# Patient Record
Sex: Male | Born: 1950 | Race: White | Hispanic: No | Marital: Married | Smoking: Former smoker
Health system: Southern US, Community
[De-identification: ages and names within clinical notes are randomized; demographics above are authoritative.]

## PROBLEM LIST (undated history)

## (undated) DIAGNOSIS — R7303 Prediabetes: Secondary | ICD-10-CM

## (undated) DIAGNOSIS — M1990 Unspecified osteoarthritis, unspecified site: Secondary | ICD-10-CM

## (undated) DIAGNOSIS — I2510 Atherosclerotic heart disease of native coronary artery without angina pectoris: Secondary | ICD-10-CM

## (undated) DIAGNOSIS — F419 Anxiety disorder, unspecified: Secondary | ICD-10-CM

## (undated) DIAGNOSIS — G709 Myoneural disorder, unspecified: Secondary | ICD-10-CM

## (undated) DIAGNOSIS — E119 Type 2 diabetes mellitus without complications: Secondary | ICD-10-CM

## (undated) DIAGNOSIS — I639 Cerebral infarction, unspecified: Secondary | ICD-10-CM

## (undated) DIAGNOSIS — I509 Heart failure, unspecified: Secondary | ICD-10-CM

## (undated) DIAGNOSIS — I499 Cardiac arrhythmia, unspecified: Secondary | ICD-10-CM

## (undated) DIAGNOSIS — Z87442 Personal history of urinary calculi: Secondary | ICD-10-CM

## (undated) DIAGNOSIS — E785 Hyperlipidemia, unspecified: Secondary | ICD-10-CM

## (undated) DIAGNOSIS — I10 Essential (primary) hypertension: Secondary | ICD-10-CM

## (undated) HISTORY — PX: BILATERAL CARPAL TUNNEL RELEASE: SHX6508

---

## 2003-06-14 HISTORY — PX: FRACTURE SURGERY: SHX138

## 2010-06-13 DIAGNOSIS — I499 Cardiac arrhythmia, unspecified: Secondary | ICD-10-CM

## 2010-06-13 HISTORY — DX: Cardiac arrhythmia, unspecified: I49.9

## 2020-09-17 ENCOUNTER — Other Ambulatory Visit (HOSPITAL_COMMUNITY): Payer: Self-pay

## 2020-09-24 ENCOUNTER — Encounter (HOSPITAL_COMMUNITY): Payer: Self-pay

## 2020-09-24 NOTE — Patient Instructions (Addendum)
DUE TO COVID-19 ONLY ONE VISITOR IS ALLOWED TO COME WITH YOU AND STAY IN THE WAITING ROOM ONLY DURING PRE OP AND PROCEDURE DAY OF SURGERY. THE 1 VISITOR  MAY VISIT WITH YOU AFTER SURGERY IN YOUR PRIVATE ROOM DURING VISITING HOURS ONLY!  YOU NEED TO HAVE A COVID 19 TEST ON__4/25_____ @_8 :45______, THIS TEST MUST BE DONE BEFORE SURGERY,  COVID TESTING SITE [address] [place] , IT IS ON THE RIGHT GOING OUT [address] APPROXIMATELY  2 MINUTES PAST [employer] ON THE RIGHT. ONCE YOUR COVID TEST IS COMPLETED,  PLEASE BEGIN THE QUARANTINE INSTRUCTIONS AS OUTLINED IN YOUR HANDOUT.                Mike [name]   Your procedure is scheduled on: 10/07/20   Report to [facility] Main  Entrance   Report to admitting at   7:45 AM     Call this number if you have problems the morning of surgery [phone]    . BRUSH YOUR TEETH MORNING OF SURGERY AND RINSE YOUR MOUTH OUT, NO CHEWING GUM CANDY OR MINTS.   No food after midnight.    You may have clear liquid until 7:00 AM.  Nothing by mouth after 7:00 AM.    Take these medicines the morning of surgery with A SIP OF WATER: Amiodarone, Gabapentin, Carvedilol   How to Manage Your Diabetes Before and After Surgery  Why is it important to control my blood sugar before and after surgery? . Improving blood sugar levels before and after surgery helps healing and can limit problems. . A way of improving blood sugar control is eating a healthy diet by: o  Eating less sugar and carbohydrates o  Increasing activity/exercise o  Talking with your doctor about reaching your blood sugar goals . High blood sugars (greater than 180 mg/dL) can raise your risk of infections and slow your recovery, so you will need to focus on controlling your diabetes during the weeks before surgery. . Make sure that the doctor who takes care of your diabetes knows about your planned surgery including the date and location.  How do  I manage my blood sugar before surgery? . Check your blood sugar at least 4 times a day, starting 2 days before surgery, to make sure that the level is not too high or low. o Check your blood sugar the morning of your surgery when you wake up and every 2 hours until you get to the Short Stay unit. . If your blood sugar is less than 70 mg/dL, you will need to treat for low blood sugar: o Do not take insulin. o Treat a low blood sugar (less than 70 mg/dL) with  cup of clear juice (cranberry or apple), 4 glucose tablets, OR glucose gel. o Recheck blood sugar in 15 minutes after treatment (to make sure it is greater than 70 mg/dL). If your blood sugar is not greater than 70 mg/dL on recheck, call [phone] for further instructions. . Report your blood sugar to the short stay nurse when you get to Short Stay.  . If you are admitted to the hospital after surgery: o Your blood sugar will be checked by the staff and you will probably be given insulin after surgery (instead of oral diabetes medicines) to make sure you have good blood sugar levels. o The goal for blood sugar control after surgery is 80-180 mg/dL.   WHAT DO I DO ABOUT MY DIABETES MEDICATION?  [phone]  Do not take oral diabetes medicines (pills) the morning of surgery.                                  You may not have any metal on your body including              piercings  Do not wear jewelry,  lotions, powders or deodorant                       Men may shave face and neck.   Do not bring valuables to the hospital. [facility] IS NOT             RESPONSIBLE   FOR VALUABLES.  Contacts, dentures or bridgework may not be worn into surgery.                   Please read over the following fact sheets you were given: _____________________________________________________________________             [facility] - Preparing for Surgery Before surgery, you can play an important role.  Because skin is not sterile, your skin needs to  be as free of germs as possible.  You can reduce the number of germs on your skin by washing with CHG (chlorahexidine gluconate) soap before surgery.  CHG is an antiseptic cleaner which kills germs and bonds with the skin to continue killing germs even after washing. Please DO NOT use if you have an allergy to CHG or antibacterial soaps.  If your skin becomes reddened/irritated stop using the CHG and inform your nurse when you arrive at Short Stay. .  You may shave your face/neck.  Please follow these instructions carefully:  1.  Shower with CHG Soap the night before surgery and the  morning of Surgery.  2.  If you choose to wash your hair, wash your hair first as usual with your  normal  shampoo.  3.  After you shampoo, rinse your hair and body thoroughly to remove the  shampoo.                                        4.  Use CHG as you would any other liquid soap.  You can apply chg directly  to the skin and wash                       Gently with a scrungie or clean washcloth.  5.  Apply the CHG Soap to your body ONLY FROM THE NECK DOWN.   Do not use on face/ open                           Wound or open sores. Avoid contact with eyes, ears mouth and genitals (private parts).                       Wash face,  Genitals (private parts) with your normal soap.             6.  Wash thoroughly, paying special attention to the area where your surgery  will be performed.  7.  Thoroughly rinse your body with warm water from the neck down.  8.  DO NOT shower/wash with  your normal soap after using and rinsing off  the CHG Soap.             9.  Pat yourself dry with a clean towel.            10.  Wear clean pajamas.            11.  Place clean sheets on your bed the night of your first shower and do not  sleep with pets. Day of Surgery : Do not apply any lotions/deodorants the morning of surgery.  Please wear clean clothes to the hospital/surgery center.  FAILURE TO FOLLOW THESE INSTRUCTIONS MAY RESULT IN  THE CANCELLATION OF YOUR SURGERY PATIENT SIGNATURE_________________________________  NURSE SIGNATURE__________________________________  ________________________________________________________________________   Mike [name]  An incentive spirometer is a tool that can help keep your lungs clear and active. This tool measures how well you are filling your lungs with each breath. Taking long deep breaths may help reverse or decrease the chance of developing breathing (pulmonary) problems (especially infection) following:  A long period of time when you are unable to move or be active. BEFORE THE PROCEDURE   If the spirometer includes an indicator to show your best effort, your nurse or respiratory therapist will set it to a desired goal.  If possible, sit up straight or lean slightly forward. Try not to slouch.  Hold the incentive spirometer in an upright position. INSTRUCTIONS FOR USE  1. Sit on the edge of your bed if possible, or sit up as far as you can in bed or on a chair. 2. Hold the incentive spirometer in an upright position. 3. Breathe out normally. 4. Place the mouthpiece in your mouth and seal your lips tightly around it. 5. Breathe in slowly and as deeply as possible, raising the piston or the ball toward the top of the column. 6. Hold your breath for 3-5 seconds or for as long as possible. Allow the piston or ball to fall to the bottom of the column. 7. Remove the mouthpiece from your mouth and breathe out normally. 8. Rest for a few seconds and repeat Steps 1 through 7 at least 10 times every 1-2 hours when you are awake. Take your time and take a few normal breaths between deep breaths. 9. The spirometer may include an indicator to show your best effort. Use the indicator as a goal to work toward during each repetition. 10. After each set of 10 deep breaths, practice coughing to be sure your lungs are clear. If you have an incision (the cut made at the time of  surgery), support your incision when coughing by placing a pillow or rolled up towels firmly against it. Once you are able to get out of bed, walk around indoors and cough well. You may stop using the incentive spirometer when instructed by your caregiver.  RISKS AND COMPLICATIONS  Take your time so you do not get dizzy or light-headed.  If you are in pain, you may need to take or ask for pain medication before doing incentive spirometry. It is harder to take a deep breath if you are having pain. AFTER USE  Rest and breathe slowly and easily.  It can be helpful to keep track of a log of your progress. Your caregiver can provide you with a simple table to help with this. If you are using the spirometer at home, follow these instructions: SEEK MEDICAL CARE IF:   You are having difficultly using the spirometer.  You  have trouble using the spirometer as often as instructed.  Your pain medication is not giving enough relief while using the spirometer.  You develop fever of 100.5 F (38.1 C) or higher. SEEK IMMEDIATE MEDICAL CARE IF:   You cough up bloody sputum that had not been present before.  You develop fever of 102 F (38.9 C) or greater.  You develop worsening pain at or near the incision site. MAKE SURE YOU:   Understand these instructions.  Will watch your condition.  Will get help right away if you are not doing well or get worse. Document Released: 10/10/2006 Document Revised: 08/22/2011 Document Reviewed: 12/11/2006 [facility] Patient Information 2014 [place], [place].   ________________________________________________________________________

## 2020-09-28 ENCOUNTER — Other Ambulatory Visit: Payer: Self-pay

## 2020-09-28 ENCOUNTER — Encounter (HOSPITAL_COMMUNITY): Payer: Self-pay

## 2020-09-28 ENCOUNTER — Encounter (HOSPITAL_COMMUNITY)
Admission: RE | Admit: 2020-09-28 | Discharge: 2020-09-28 | Disposition: A | Discharge status: Home / Self Care | Payer: Medicare Other | Source: Ambulatory Visit | Attending: Orthopedic Surgery | Admitting: Orthopedic Surgery

## 2020-09-28 DIAGNOSIS — Z01812 Encounter for preprocedural laboratory examination: Secondary | ICD-10-CM | POA: Insufficient documentation

## 2020-09-28 HISTORY — DX: Unspecified osteoarthritis, unspecified site: M19.90

## 2020-09-28 HISTORY — DX: Type 2 diabetes mellitus without complications: E11.9

## 2020-09-28 HISTORY — DX: Personal history of urinary calculi: Z87.442

## 2020-09-28 HISTORY — DX: Essential (primary) hypertension: I10

## 2020-09-28 HISTORY — DX: Cardiac arrhythmia, unspecified: I49.9

## 2020-09-28 HISTORY — DX: Atherosclerotic heart disease of native coronary artery without angina pectoris: I25.10

## 2020-09-28 HISTORY — DX: Hyperlipidemia, unspecified: E78.5

## 2020-09-28 LAB — COMPREHENSIVE METABOLIC PANEL
ALT: 22 U/L (ref 0–44)
AST: 25 U/L (ref 15–41)
Albumin: 3.6 g/dL (ref 3.5–5.0)
Alkaline Phosphatase: 69 U/L (ref 38–126)
Anion gap: 9 (ref 5–15)
BUN: 28 mg/dL — ABNORMAL HIGH (ref 8–23)
CO2: 25 mmol/L (ref 22–32)
Calcium: 8.9 mg/dL (ref 8.9–10.3)
Chloride: 107 mmol/L (ref 98–111)
Creatinine, Ser: 1.32 mg/dL — ABNORMAL HIGH (ref 0.61–1.24)
GFR, Estimated: 59 mL/min — ABNORMAL LOW (ref >60)
Glucose, Bld: 124 mg/dL — ABNORMAL HIGH (ref 70–99)
Potassium: 4.3 mmol/L (ref 3.5–5.1)
Sodium: 141 mmol/L (ref 135–145)
Total Bilirubin: 0.5 mg/dL (ref 0.3–1.2)
Total Protein: 6.9 g/dL (ref 6.5–8.1)

## 2020-09-28 LAB — CBC
HCT: 35.2 % — ABNORMAL LOW (ref 39.0–52.0)
Hemoglobin: 11.5 g/dL — ABNORMAL LOW (ref 13.0–17.0)
MCH: 33 pg (ref 26.0–34.0)
MCHC: 32.7 g/dL (ref 30.0–36.0)
MCV: 100.9 fL — ABNORMAL HIGH (ref 80.0–100.0)
Platelets: 267 10*3/uL (ref 150–400)
RBC: 3.49 MIL/uL — ABNORMAL LOW (ref 4.22–5.81)
RDW: 14.6 % (ref 11.5–15.5)
WBC: 12.4 10*3/uL — ABNORMAL HIGH (ref 4.0–10.5)
nRBC: 0 % (ref 0.0–0.2)

## 2020-09-28 LAB — GLUCOSE, CAPILLARY: Glucose-Capillary: 124 mg/dL — ABNORMAL HIGH (ref 70–99)

## 2020-09-28 LAB — APTT: aPTT: 63 seconds — ABNORMAL HIGH (ref 24–36)

## 2020-09-28 LAB — PROTIME-INR
INR: 3.2 — ABNORMAL HIGH (ref 0.8–1.2)
Prothrombin Time: 33.1 seconds — ABNORMAL HIGH (ref 11.4–15.2)

## 2020-09-28 LAB — SURGICAL PCR SCREEN
MRSA, PCR: NEGATIVE
Staphylococcus aureus: NEGATIVE

## 2020-09-28 NOTE — Progress Notes (Signed)
COVID Vaccine Completed:no Date COVID Vaccine completed: COVID vaccine manufacturer: [employer]   PCP - Dr. Rudolpho Sevin Cardiologist - Dr. Rudolpho Sevin  Chest x-ray - no EKG - 07/17/20- Care everywhere Stress Test - 08/04/20-care everywhere ECHO - 07/22/20-care everywhere Cardiac Cath - no Pacemaker/ICD device last checked:NA  Sleep Study - no CPAP -   Fasting Blood Sugar - Pt doesn't test. He denies having diabetes but takes Metformin for blood sugar Checks Blood Sugar _____ times a day  Blood Thinner Instructions:Xarelto and ASA/ Dr. Rudolpho Sevin Aspirin Instructions:Stop 3 days prior to DOS with both/ Aluisio Last Dose:10/02/20  Anesthesia review:   Patient denies shortness of breath, fever, cough and chest pain at PAT appointment yes Patient verbalized understanding of instructions that were given to them at the PAT appointment. Patient was also instructed that they will need to review over the PAT instructions again at home before surgery.yes Pt reports no SOB with any activities

## 2020-09-29 LAB — HEMOGLOBIN A1C
Hgb A1c MFr Bld: 5.5 % (ref 4.8–5.6)
Mean Plasma Glucose: 111 mg/dL

## 2020-10-01 NOTE — Progress Notes (Signed)
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 Last office visit 07/17/20 (notes in care everywhere)   LABS:  - PT 33.1. Pt taking xarelto. Will recheck DOS.    (all labs ordered are listed, but only abnormal results are displayed)  Labs Reviewed  CBC - Abnormal; Notable for the following components:      Result Value   WBC 12.4 (*)    RBC 3.49 (*)    Hemoglobin 11.5 (*)    HCT 35.2 (*)    MCV 100.9 (*)    All other components within normal limits  COMPREHENSIVE METABOLIC PANEL - Abnormal; Notable for the following components:   Glucose, Bld 124 (*)    BUN 28 (*)    Creatinine, Ser 1.32 (*)    GFR, Estimated 59 (*)    All other components within normal limits  PROTIME-INR - Abnormal; Notable for the following components:   Prothrombin Time 33.1 (*)    INR 3.2 (*)    All other components within normal limits  APTT - Abnormal; Notable for the following components:   aPTT 63 (*)    All other components within normal limits  GLUCOSE, CAPILLARY - Abnormal; Notable for the following components:   Glucose-Capillary 124 (*)    All other components within normal limits  SURGICAL PCR SCREEN  HEMOGLOBIN A1C  TYPE AND SCREEN     EKG 07/17/18 (Dr. 09/15/18 office): Sinus bradycardia. Nonspecific T wave changes  - tracing requested from Dr. Jerrell Mylar office.    CV: Nuclear stress test 08/04/20 (care everywhere): - Fixed Apical & inferior wall defect without associated wall motion  abnormality compatible with tissue attenuation.   - No significant ischemia is suggested  - Normal left ventricular function with ejection fraction calculated at 64 %   Echo 07/22/20 (care everywhere):  - The left ventricular size is normal.  - There is normal left ventricular wall thickness.  - LV ejection fraction = 55-60%.  - Left ventricular systolic function is normal.  - Left ventricular filling pattern is normal.  - The left ventricular wall motion is normal.  - The left atrium is borderline dilated.  - There is mild aortic regurgitation.     Past Medical History:  Diagnosis Date  . Arthritis    hip, hands thumbs  . Coronary artery disease   . Diabetes mellitus without complication (HCC)   . Dysrhythmia 2012   A-Fib  . History of kidney stones   . Hyperlipidemia   . Hypertension     MEDICATIONS: . amiodarone (PACERONE) 200 MG tablet  . Ascorbic Acid (  VITAMIN C) 1000 MG tablet  . aspirin EC 81 MG tablet  . B Complex-C (B-COMPLEX WITH VITAMIN C) tablet  . carvedilol (COREG) 12.5 MG tablet  . Cholecalciferol 25 MCG (1000 UT) tablet  . gabapentin (NEURONTIN) 300 MG capsule  . ibuprofen (ADVIL) 800 MG tablet  . losartan (COZAAR) 100 MG tablet  . Menthol-Methyl Salicylate (MUSCLE RUB) 10-15 % CREA  . metFORMIN (GLUCOPHAGE) 500 MG tablet  . Multiple Vitamin (MULTIVITAMIN WITH MINERALS) TABS tablet  . pravastatin (PRAVACHOL) 20 MG tablet  . rivaroxaban (XARELTO) 20 MG TABS tablet  . Selenium (SELENIMIN-200 PO)  . Zinc 30 MG TABS   No current facility-administered medications for this encounter.    If no changes, I anticipate pt can proceed with surgery as scheduled.   Rica Mast, PhD, FNP-BC [facility]  Stay [facility]/Anesthesiology Phone: [phone] 10/01/2020 2:29 PM

## 2020-10-01 NOTE — Anesthesia Preprocedure Evaluation (Addendum)
Anesthesia Evaluation  Patient identified by MRN, date of birth, ID band Patient awake    Reviewed: Allergy & Precautions, NPO status , Patient's Chart, lab work & pertinent test results  Airway Mallampati: II  TM Distance: >3 FB Neck ROM: Full    Dental  (+) Dental Advisory Given, Chipped, Teeth Intact,    Pulmonary neg pulmonary ROS, former smoker,    Pulmonary exam normal breath sounds clear to auscultation       Cardiovascular hypertension, Pt. on medications and Pt. on home beta blockers + CAD  Normal cardiovascular exam+ dysrhythmias Atrial Fibrillation  Rhythm:Regular Rate:Normal     Neuro/Psych negative neurological ROS     GI/Hepatic negative GI ROS, Neg liver ROS,   Endo/Other  diabetes  Renal/GU negative Renal ROS     Musculoskeletal  (+) Arthritis ,   Abdominal   Peds  Hematology negative hematology ROS (+)   Anesthesia Other Findings   Reproductive/Obstetrics                           Anesthesia Physical Anesthesia Plan  ASA: III  Anesthesia Plan: General   Post-op Pain Management:    Induction: Intravenous  PONV Risk Score and Plan: 3 and Ondansetron, Dexamethasone, Midazolam, Treatment may vary due to age or medical condition and Diphenhydramine  Airway Management Planned: Oral ETT  Additional Equipment: None  Intra-op Plan:   Post-operative Plan: Extubation in OR  Informed Consent: I have reviewed the patients History and Physical, chart, labs and discussed the procedure including the risks, benefits and alternatives for the proposed anesthesia with the patient or authorized representative who has indicated his/her understanding and acceptance.     Dental advisory given  Plan Discussed with: CRNA  Anesthesia Plan Comments: (See APP note by Joslyn Hy, FNP )      Anesthesia Quick Evaluation

## 2020-10-05 ENCOUNTER — Other Ambulatory Visit (HOSPITAL_COMMUNITY)
Admission: RE | Admit: 2020-10-05 | Discharge: 2020-10-05 | Disposition: A | Discharge status: Home / Self Care | Payer: Medicare Other | Source: Ambulatory Visit | Attending: Orthopedic Surgery | Admitting: Orthopedic Surgery

## 2020-10-05 ENCOUNTER — Other Ambulatory Visit (HOSPITAL_COMMUNITY): Payer: Medicare Other

## 2020-10-05 DIAGNOSIS — Z01812 Encounter for preprocedural laboratory examination: Secondary | ICD-10-CM | POA: Insufficient documentation

## 2020-10-05 DIAGNOSIS — Z20822 Contact with and (suspected) exposure to covid-19: Secondary | ICD-10-CM | POA: Insufficient documentation

## 2020-10-05 NOTE — Progress Notes (Signed)
EKG tracing was requested from [facility] twice since 09/28/20. No response. EKG will be done DOS.

## 2020-10-06 ENCOUNTER — Encounter (HOSPITAL_COMMUNITY): Payer: Self-pay | Admitting: Orthopedic Surgery

## 2020-10-06 LAB — SARS CORONAVIRUS 2 (TAT 6-24 HRS): SARS Coronavirus 2: NEGATIVE

## 2020-10-06 NOTE — H&P (Signed)
TOTAL HIP REVISION ADMISSION H&P  Patient is admitted for conversion of previous hip surgery to left total hip arthroplasty.   Subjective:  Chief Complaint: Left hip pain  HPI: Mike [name] presented to the clinic to discuss a left total hip arthroplasty. He previously had an IA injection 03/25/2020. He did not get any relief from the injection and his pain is severe. He states the pain is so severe in the evening he can barely move. He is using a cane to walk and takes oxycodone 10 mg 2-3 x day. Mike [name] is a pleasant 70 year old male who comes to the clinic for follow-up of bilateral hip osteoarthritis. The patient indicates he is experiencing significant pain in his bilateral hips. He notes his left hip is more bothersome than his right hip. He states his pain is focal to his entire hip. The patient affirms pain in his groin area which radiates down his leg. He notes he is losing more mobility. He states the injection he received into his right hip did not provide any relief. The patient indicates his pain began to worsen approximately 1 month ago. He notes his pain is unbearable at times. He states he is having difficulty doing things in back yard and garden. He states he has to do a little activity and take a break.   We discussed the patient's presenting complaints, history, and treatment options. He has advanced end-stage arthritis of both hips, but the left is way more symptomatic than the right. He had an injection on the right, which helped, but the left side did not help. The patient has significant pain and dysfunction in their left hip. The patient has significant functional limitations and has failed non-operative management. At this [name], the most predictable means of improving his pain and function would be conversion of the previous hip surgery to a total hip arthroplasty. This will require removal of that plate. I told him it is going to be complex and this needs to be done by a  posterior approach. If we do a posterior approach, we can do this all in 1 setting. The other option is to do it in 2 stages, removing the plate first, and then coming back and doing the hip anteriorly. He is hurting so badly that he cannot wait that long to do a 2-stage procedure. It needs to be done in 1 stage. I told him it will be difficult to remove his broken screws, but we will be able to do this and we will be able to get him a good, functional hip.    We discussed the procedure risks, potential complications, and the rehabilitation course in detail. At this time, the patient would like to go ahead and proceed with conversion of previous hip surgery to a left total hip arthroplasty by a posterior approach. We will schedule this at the patient's earliest convenience. All the information has been given to Mike [name] for scheduling. He will require cardiology clearance and is in the process of getting that done.    There are no problems to display for this patient.   Past Medical History:  Diagnosis Date  . Arthritis    hip, hands thumbs  . Coronary artery disease   . Diabetes mellitus without complication (HCC)   . Dysrhythmia 2012   A-Fib  . History of kidney stones   . Hyperlipidemia   . Hypertension     History reviewed. No pertinent surgical history.  Prior to Admission medications   Medication  Sig Start Date End Date Taking? Authorizing Provider  amiodarone (PACERONE) 200 MG tablet Take 200 mg by mouth in the morning.   Yes [provider]  Ascorbic Acid (VITAMIN C) 1000 MG tablet Take 1,000 mg by mouth in the morning.   Yes [provider]  aspirin EC 81 MG tablet Take 81 mg by mouth in the morning and at bedtime. Swallow whole.   Yes [provider]  B Complex-C (B-COMPLEX WITH VITAMIN C) tablet Take 1 tablet by mouth in the morning.   Yes [provider]  carvedilol (COREG) 12.5 MG tablet Take 12.5 mg by mouth in the morning and at bedtime.    Yes [provider]  Cholecalciferol 25 MCG (1000 UT) tablet Take 1,000 Units by mouth in the morning.   Yes [provider]  gabapentin (NEURONTIN) 300 MG capsule Take 600 mg by mouth 2 (two) times daily.   Yes [provider]  ibuprofen (ADVIL) 800 MG tablet Take 800 mg by mouth every 8 (eight) hours as needed (leg pain).   Yes [provider]  losartan (COZAAR) 100 MG tablet Take 100 mg by mouth in the morning.   Yes [provider]  Menthol-Methyl Salicylate (MUSCLE RUB) 10-15 % CREA Apply 1 application topically 4 (four) times daily as needed for muscle pain.   Yes [provider]  metFORMIN (GLUCOPHAGE) 500 MG tablet Take 500 mg by mouth in the morning and at bedtime.   Yes [provider]  Multiple Vitamin (MULTIVITAMIN WITH MINERALS) TABS tablet Take 1 tablet by mouth in the morning.   Yes [provider]  pravastatin (PRAVACHOL) 20 MG tablet Take 20 mg by mouth at bedtime.   Yes [provider]  rivaroxaban (XARELTO) 20 MG TABS tablet Take 20 mg by mouth in the morning.   Yes [provider]  Selenium (SELENIMIN-200 PO) Take 200 mcg by mouth in the morning.   Yes [provider]  Zinc 30 MG TABS Take 30 mg by mouth in the morning.   Yes [provider]    No Known Allergies  Social History   Socioeconomic History  . Marital status: Married    Spouse name: Not on file  . Number of children: Not on file  . Years of education: Not on file  . Highest education level: Not on file  Occupational History  . Not on file  Tobacco Use  . Smoking status: Former Smoker    Packs/day: 0.25    Years: 5.00    Pack years: 1.25    Types: Cigarettes    Quit date: 1970    Years since quitting: 52.3  . Smokeless tobacco: Never Used  Vaping Use  . Vaping Use: Never used  Substance and Sexual Activity  . Alcohol use: Never  . Drug use: Never  . Sexual activity: Not on file  Other  Topics Concern  . Not on file  Social History Narrative  . Not on file   Social Determinants of Health   Financial Resource Strain: Not on file  Food Insecurity: Not on file  Transportation Needs: Not on file  Physical Activity: Not on file  Stress: Not on file  Social Connections: Not on file  Intimate Partner Violence: Not on file    Tobacco Use: Medium Risk  . Smoking Tobacco Use: Former Smoker  . Smokeless Tobacco Use: Never Used   Social History   Substance and Sexual Activity  Alcohol Use Never  History reviewed. No pertinent family history.  ROS: Constitutional: no fever, no chills, no night sweats, no significant weight loss Cardiovascular: no chest pain, no palpitations Respiratory: no cough, no shortness of breath, No COPD Gastrointestinal: no vomiting, no nausea Musculoskeletal: no swelling in Joints, Joint Pain Neurologic: no numbness, no tingling, no difficulty with balance    Objective:  Physical Exam: Well nourished and well developed.  General: Alert and oriented x3, cooperative and pleasant, no acute distress.  Head: normocephalic, atraumatic, neck supple.  Eyes: EOMI.  Respiratory: breath sounds clear in all fields, no wheezing, rales, or rhonchi. Cardiovascular: Regular rate and rhythm, no murmurs, gallops or rubs.  Abdomen: non-tender to palpation and soft, normoactive bowel sounds. Musculoskeletal:  The patient is having a very difficult time ambulating today. He is utilizing a cane and has a significantly antalgic gait pattern.    Right Hip Exam:  Range of motion: Flexion to 100 degrees, internal rotation to 10 degrees, external rotation to 20 degrees, and abduction to 20 degrees with pain on range of motion.    Left Hip Exam:  Range of motion: Flexion to 100 degrees with pain, internal rotation is minimal, external rotation to 10 to 20 degrees, and abduction to 20 degrees with pain.  There is positive tenderness over the greater  trochanteric bursa.    The patient's sensation and motor function are intact in their lower extremities. Their distal pulses are 2+. The bilateral calves are soft and non-tender.   Vital signs in last 24 hours:    Imaging Review AP pelvis, AP and lateral of the left hip dated 07/24/2020 demonstrate a blade plate on that side with approximately 6 screws. The bottom 2 screws are broken. He has severe end-stage arthritis, bone on bone, with large osteophyte formation and some erosion of the femoral head. He also has advanced arthritic change of the right hip.  Assessment/Plan:  Failure of previous hip surgery, left hip  The patient history, physical examination, clinical judgement of the provider and imaging studies are consistent with end stage degenerative joint disease of the left hip and total hip arthroplasty is deemed medically necessary. The treatment options including medical management, injection therapy, arthroscopy and arthroplasty were discussed at length. The risks and benefits of total hip arthroplasty were presented and reviewed. The risks due to aseptic loosening, infection, stiffness, dislocation/subluxation, thromboembolic complications and other imponderables were discussed. The patient acknowledged the explanation, agreed to proceed with the plan and consent was signed. Patient is being admitted for inpatient treatment for surgery, pain control, PT, OT, prophylactic antibiotics, VTE prophylaxis, progressive ambulation and ADLs and discharge planning.The patient is planning to be discharged home.   Patient's anticipated LOS is less than 2 midnights, meeting these requirements: - Lives within 1 hour of care - Has a competent adult at home to recover with post-op recover - NO history of  - Chronic pain requiring opioids  - Diabetes  - Coronary Artery Disease  - Heart failure  - Heart attack  - Stroke  - DVT/VTE  - Cardiac arrhythmia  - Respiratory Failure/COPD  - Renal  failure  - Anemia  - Advanced Liver disease   [name] [name] Disposition: Home with wife Planned DVT Prophylaxis: Xarelto DME Needed: None PCP: Mike [name] (clearance not received) Cardiology: Mike [name] (clearance received) TXA: IV Allergies: NKDA Anesthesia Concerns: None BMI: 26.6 Last HgbA1c: N/A  Pharm[place] [facility] [address] [name]     - Patient was instructed on what medications to stop prior to surgery. - Follow-up visit in 2 weeks with Dr. Lequita Halt - Begin physical therapy following surgery - Pre-operative lab work as pre-surgical testing - Prescriptions will be provided in hospital at time of discharge  Mike [name], [facility], PA-C Orthopedic Surgery [facility] [place]

## 2020-10-07 ENCOUNTER — Inpatient Hospital Stay (HOSPITAL_COMMUNITY): Payer: Medicare Other | Admitting: Emergency Medicine

## 2020-10-07 ENCOUNTER — Observation Stay (HOSPITAL_COMMUNITY): Payer: Medicare Other

## 2020-10-07 ENCOUNTER — Encounter (HOSPITAL_COMMUNITY): Payer: Self-pay | Admitting: Orthopedic Surgery

## 2020-10-07 ENCOUNTER — Inpatient Hospital Stay (HOSPITAL_COMMUNITY)
Admission: RE | Admit: 2020-10-07 | Discharge: 2020-10-08 | DRG: 470 | Disposition: A | Discharge status: Home Health | Payer: Medicare Other | Source: Ambulatory Visit | Attending: Orthopedic Surgery | Admitting: Orthopedic Surgery

## 2020-10-07 ENCOUNTER — Encounter (HOSPITAL_COMMUNITY)
Admission: RE | Disposition: A | Discharge status: Home / Self Care | Payer: Self-pay | Source: Ambulatory Visit | Attending: Orthopedic Surgery

## 2020-10-07 ENCOUNTER — Other Ambulatory Visit: Payer: Self-pay

## 2020-10-07 DIAGNOSIS — E785 Hyperlipidemia, unspecified: Secondary | ICD-10-CM | POA: Diagnosis present

## 2020-10-07 DIAGNOSIS — Z87891 Personal history of nicotine dependence: Secondary | ICD-10-CM | POA: Diagnosis not present

## 2020-10-07 DIAGNOSIS — Z7901 Long term (current) use of anticoagulants: Secondary | ICD-10-CM | POA: Diagnosis not present

## 2020-10-07 DIAGNOSIS — I10 Essential (primary) hypertension: Secondary | ICD-10-CM | POA: Diagnosis present

## 2020-10-07 DIAGNOSIS — T8489XA Other specified complication of internal orthopedic prosthetic devices, implants and grafts, initial encounter: Secondary | ICD-10-CM | POA: Diagnosis present

## 2020-10-07 DIAGNOSIS — Z7984 Long term (current) use of oral hypoglycemic drugs: Secondary | ICD-10-CM

## 2020-10-07 DIAGNOSIS — I2510 Atherosclerotic heart disease of native coronary artery without angina pectoris: Secondary | ICD-10-CM | POA: Diagnosis present

## 2020-10-07 DIAGNOSIS — E119 Type 2 diabetes mellitus without complications: Secondary | ICD-10-CM | POA: Diagnosis present

## 2020-10-07 DIAGNOSIS — Z79899 Other long term (current) drug therapy: Secondary | ICD-10-CM | POA: Diagnosis not present

## 2020-10-07 DIAGNOSIS — Z28310 Unvaccinated for covid-19: Secondary | ICD-10-CM | POA: Diagnosis not present

## 2020-10-07 DIAGNOSIS — M169 Osteoarthritis of hip, unspecified: Secondary | ICD-10-CM | POA: Diagnosis present

## 2020-10-07 DIAGNOSIS — Z20822 Contact with and (suspected) exposure to covid-19: Secondary | ICD-10-CM | POA: Diagnosis present

## 2020-10-07 DIAGNOSIS — Z96642 Presence of left artificial hip joint: Secondary | ICD-10-CM | POA: Diagnosis present

## 2020-10-07 DIAGNOSIS — Y848 Other medical procedures as the cause of abnormal reaction of the patient, or of later complication, without mention of misadventure at the time of the procedure: Secondary | ICD-10-CM | POA: Diagnosis present

## 2020-10-07 DIAGNOSIS — Z7982 Long term (current) use of aspirin: Secondary | ICD-10-CM | POA: Diagnosis not present

## 2020-10-07 DIAGNOSIS — M1612 Unilateral primary osteoarthritis, left hip: Secondary | ICD-10-CM | POA: Diagnosis present

## 2020-10-07 DIAGNOSIS — Z96649 Presence of unspecified artificial hip joint: Secondary | ICD-10-CM

## 2020-10-07 HISTORY — PX: TOTAL HIP ARTHROPLASTY: SHX124

## 2020-10-07 LAB — APTT: aPTT: 36 seconds (ref 24–36)

## 2020-10-07 LAB — GLUCOSE, CAPILLARY
Glucose-Capillary: 106 mg/dL — ABNORMAL HIGH (ref 70–99)
Glucose-Capillary: 106 mg/dL — ABNORMAL HIGH (ref 70–99)
Glucose-Capillary: 120 mg/dL — ABNORMAL HIGH (ref 70–99)
Glucose-Capillary: 218 mg/dL — ABNORMAL HIGH (ref 70–99)

## 2020-10-07 LAB — ABO/RH: ABO/RH(D): A POS

## 2020-10-07 LAB — PROTIME-INR
INR: 1 (ref 0.8–1.2)
Prothrombin Time: 13.3 seconds (ref 11.4–15.2)

## 2020-10-07 SURGERY — ARTHROPLASTY, HIP, TOTAL,POSTERIOR APPROACH
Anesthesia: General | Site: Hip | Laterality: Left

## 2020-10-07 MED ORDER — KETAMINE HCL 10 MG/ML IJ SOLN
INTRAMUSCULAR | Status: AC
  Filled 2020-10-07: qty 1

## 2020-10-07 MED ORDER — ONDANSETRON HCL 4 MG/2ML IJ SOLN
INTRAMUSCULAR | Status: DC | PRN
  Administered 2020-10-07: 4 mg via INTRAVENOUS

## 2020-10-07 MED ORDER — DEXAMETHASONE SODIUM PHOSPHATE 10 MG/ML IJ SOLN
INTRAMUSCULAR | Status: AC
  Filled 2020-10-07: qty 1

## 2020-10-07 MED ORDER — ROCURONIUM BROMIDE 10 MG/ML (PF) SYRINGE
PREFILLED_SYRINGE | INTRAVENOUS | Status: DC | PRN
  Administered 2020-10-07: 60 mg via INTRAVENOUS
  Administered 2020-10-07: 10 mg via INTRAVENOUS

## 2020-10-07 MED ORDER — PRAVASTATIN SODIUM 20 MG PO TABS
20.0000 mg | ORAL_TABLET | Freq: Every day | ORAL | Status: DC
  Filled 2020-10-07: qty 1

## 2020-10-07 MED ORDER — PROPOFOL 10 MG/ML IV BOLUS
INTRAVENOUS | Status: AC
  Filled 2020-10-07: qty 20

## 2020-10-07 MED ORDER — METOCLOPRAMIDE HCL 5 MG PO TABS: 5.0000 mg | ORAL_TABLET | Freq: Three times a day (TID) | ORAL | Status: DC | PRN

## 2020-10-07 MED ORDER — TRAMADOL HCL 50 MG PO TABS
50.0000 mg | ORAL_TABLET | Freq: Four times a day (QID) | ORAL | Status: DC | PRN
  Administered 2020-10-07 – 2020-10-08 (×2): 100 mg via ORAL
  Filled 2020-10-07 (×2): qty 2

## 2020-10-07 MED ORDER — PROPOFOL 1000 MG/100ML IV EMUL
INTRAVENOUS | Status: AC
  Filled 2020-10-07: qty 100

## 2020-10-07 MED ORDER — DIPHENHYDRAMINE HCL 50 MG/ML IJ SOLN
INTRAMUSCULAR | Status: AC
  Filled 2020-10-07: qty 1

## 2020-10-07 MED ORDER — FENTANYL CITRATE (PF) 100 MCG/2ML IJ SOLN
INTRAMUSCULAR | Status: AC
  Filled 2020-10-07: qty 2

## 2020-10-07 MED ORDER — CARVEDILOL 12.5 MG PO TABS
12.5000 mg | ORAL_TABLET | Freq: Two times a day (BID) | ORAL | Status: DC
  Administered 2020-10-07 – 2020-10-08 (×2): 12.5 mg via ORAL
  Filled 2020-10-07 (×2): qty 1

## 2020-10-07 MED ORDER — ALBUMIN HUMAN 5 % IV SOLN
INTRAVENOUS | Status: AC
  Filled 2020-10-07: qty 500

## 2020-10-07 MED ORDER — MIDAZOLAM HCL 5 MG/5ML IJ SOLN
INTRAMUSCULAR | Status: DC | PRN
  Administered 2020-10-07: 2 mg via INTRAVENOUS

## 2020-10-07 MED ORDER — DOCUSATE SODIUM 100 MG PO CAPS
100.0000 mg | ORAL_CAPSULE | Freq: Two times a day (BID) | ORAL | Status: DC
  Administered 2020-10-07 – 2020-10-08 (×2): 100 mg via ORAL
  Filled 2020-10-07 (×2): qty 1

## 2020-10-07 MED ORDER — HYDROMORPHONE HCL 1 MG/ML IJ SOLN
0.2500 mg | INTRAMUSCULAR | Status: DC | PRN
  Administered 2020-10-07 (×2): 0.25 mg via INTRAVENOUS

## 2020-10-07 MED ORDER — DEXAMETHASONE SODIUM PHOSPHATE 10 MG/ML IJ SOLN
8.0000 mg | Freq: Once | INTRAMUSCULAR | Status: AC
  Administered 2020-10-07: 8 mg via INTRAVENOUS

## 2020-10-07 MED ORDER — METHOCARBAMOL 500 MG IVPB - SIMPLE MED
500.0000 mg | Freq: Four times a day (QID) | INTRAVENOUS | Status: DC | PRN
  Administered 2020-10-07: 500 mg via INTRAVENOUS
  Filled 2020-10-07: qty 50

## 2020-10-07 MED ORDER — CELECOXIB 200 MG PO CAPS
200.0000 mg | ORAL_CAPSULE | Freq: Once | ORAL | Status: AC
  Administered 2020-10-07: 200 mg via ORAL

## 2020-10-07 MED ORDER — MENTHOL 3 MG MT LOZG: 1.0000 | LOZENGE | OROMUCOSAL | Status: DC | PRN

## 2020-10-07 MED ORDER — CEFAZOLIN SODIUM-DEXTROSE 2-4 GM/100ML-% IV SOLN
2.0000 g | INTRAVENOUS | Status: AC
  Administered 2020-10-07: 2 g via INTRAVENOUS
  Filled 2020-10-07: qty 100

## 2020-10-07 MED ORDER — PHENOL 1.4 % MT LIQD: 1.0000 | OROMUCOSAL | Status: DC | PRN

## 2020-10-07 MED ORDER — FENTANYL CITRATE (PF) 100 MCG/2ML IJ SOLN
INTRAMUSCULAR | Status: AC
  Administered 2020-10-07: 50 ug via INTRAVENOUS
  Filled 2020-10-07: qty 2

## 2020-10-07 MED ORDER — KETAMINE HCL 10 MG/ML IJ SOLN
INTRAMUSCULAR | Status: DC | PRN
  Administered 2020-10-07: 30 mg via INTRAVENOUS

## 2020-10-07 MED ORDER — METHOCARBAMOL 500 MG PO TABS
500.0000 mg | ORAL_TABLET | Freq: Four times a day (QID) | ORAL | Status: DC | PRN
  Administered 2020-10-07 – 2020-10-08 (×3): 500 mg via ORAL
  Filled 2020-10-07 (×3): qty 1

## 2020-10-07 MED ORDER — ACETAMINOPHEN 325 MG PO TABS: 325.0000 mg | ORAL_TABLET | Freq: Four times a day (QID) | ORAL | Status: DC | PRN

## 2020-10-07 MED ORDER — ALBUMIN HUMAN 5 % IV SOLN: INTRAVENOUS | Status: DC | PRN

## 2020-10-07 MED ORDER — SUGAMMADEX SODIUM 200 MG/2ML IV SOLN
INTRAVENOUS | Status: DC | PRN
  Administered 2020-10-07: 200 mg via INTRAVENOUS

## 2020-10-07 MED ORDER — TRANEXAMIC ACID-NACL 1000-0.7 MG/100ML-% IV SOLN
1000.0000 mg | INTRAVENOUS | Status: AC
  Administered 2020-10-07: 1000 mg via INTRAVENOUS
  Filled 2020-10-07: qty 100

## 2020-10-07 MED ORDER — ROCURONIUM BROMIDE 10 MG/ML (PF) SYRINGE
PREFILLED_SYRINGE | INTRAVENOUS | Status: AC
  Filled 2020-10-07: qty 10

## 2020-10-07 MED ORDER — GABAPENTIN 300 MG PO CAPS
600.0000 mg | ORAL_CAPSULE | Freq: Two times a day (BID) | ORAL | Status: DC
  Administered 2020-10-07 – 2020-10-08 (×2): 600 mg via ORAL
  Filled 2020-10-07 (×2): qty 2

## 2020-10-07 MED ORDER — LACTATED RINGERS IV SOLN: INTRAVENOUS | Status: DC

## 2020-10-07 MED ORDER — POVIDONE-IODINE 10 % EX SWAB
2.0000 "application " | Freq: Once | CUTANEOUS | Status: AC
  Administered 2020-10-07: 2 via TOPICAL

## 2020-10-07 MED ORDER — RIVAROXABAN 10 MG PO TABS
10.0000 mg | ORAL_TABLET | Freq: Every day | ORAL | Status: DC
  Administered 2020-10-08: 10 mg via ORAL
  Filled 2020-10-07: qty 1

## 2020-10-07 MED ORDER — CELECOXIB 200 MG PO CAPS
ORAL_CAPSULE | ORAL | Status: AC
  Filled 2020-10-07: qty 1

## 2020-10-07 MED ORDER — CEFAZOLIN SODIUM-DEXTROSE 2-4 GM/100ML-% IV SOLN
2.0000 g | Freq: Four times a day (QID) | INTRAVENOUS | Status: AC
  Administered 2020-10-07 (×2): 2 g via INTRAVENOUS
  Filled 2020-10-07 (×2): qty 100

## 2020-10-07 MED ORDER — LIDOCAINE 2% (20 MG/ML) 5 ML SYRINGE
INTRAMUSCULAR | Status: DC | PRN
  Administered 2020-10-07: 80 mg via INTRAVENOUS

## 2020-10-07 MED ORDER — LOSARTAN POTASSIUM 50 MG PO TABS
100.0000 mg | ORAL_TABLET | Freq: Every day | ORAL | Status: DC
  Administered 2020-10-08: 100 mg via ORAL
  Filled 2020-10-07 (×2): qty 2

## 2020-10-07 MED ORDER — ACETAMINOPHEN 10 MG/ML IV SOLN
1000.0000 mg | Freq: Four times a day (QID) | INTRAVENOUS | Status: DC
  Administered 2020-10-07: 1000 mg via INTRAVENOUS
  Filled 2020-10-07: qty 100

## 2020-10-07 MED ORDER — MAGNESIUM CITRATE PO SOLN: 1.0000 | Freq: Once | ORAL | Status: DC | PRN

## 2020-10-07 MED ORDER — MORPHINE SULFATE (PF) 2 MG/ML IV SOLN: 0.5000 mg | INTRAVENOUS | Status: DC | PRN

## 2020-10-07 MED ORDER — DEXMEDETOMIDINE (PRECEDEX) IN NS 20 MCG/5ML (4 MCG/ML) IV SYRINGE
PREFILLED_SYRINGE | INTRAVENOUS | Status: DC | PRN
  Administered 2020-10-07: 4 ug via INTRAVENOUS

## 2020-10-07 MED ORDER — BUPIVACAINE-EPINEPHRINE (PF) 0.25% -1:200000 IJ SOLN
INTRAMUSCULAR | Status: AC
  Filled 2020-10-07: qty 30

## 2020-10-07 MED ORDER — ONDANSETRON HCL 4 MG/2ML IJ SOLN: 4.0000 mg | Freq: Four times a day (QID) | INTRAMUSCULAR | Status: DC | PRN

## 2020-10-07 MED ORDER — FENTANYL CITRATE (PF) 100 MCG/2ML IJ SOLN
INTRAMUSCULAR | Status: DC | PRN
  Administered 2020-10-07: 25 ug via INTRAVENOUS
  Administered 2020-10-07 (×2): 50 ug via INTRAVENOUS

## 2020-10-07 MED ORDER — FENTANYL CITRATE (PF) 100 MCG/2ML IJ SOLN
50.0000 ug | INTRAMUSCULAR | Status: DC
  Administered 2020-10-07: 50 ug via INTRAVENOUS

## 2020-10-07 MED ORDER — CHLORHEXIDINE GLUCONATE 0.12 % MT SOLN
15.0000 mL | Freq: Once | OROMUCOSAL | Status: AC
  Administered 2020-10-07: 15 mL via OROMUCOSAL

## 2020-10-07 MED ORDER — MEPERIDINE HCL 50 MG/ML IJ SOLN: 6.2500 mg | INTRAMUSCULAR | Status: DC | PRN

## 2020-10-07 MED ORDER — DEXAMETHASONE SODIUM PHOSPHATE 10 MG/ML IJ SOLN
10.0000 mg | Freq: Once | INTRAMUSCULAR | Status: AC
  Administered 2020-10-08: 10 mg via INTRAVENOUS
  Filled 2020-10-07: qty 1

## 2020-10-07 MED ORDER — HYDROCODONE-ACETAMINOPHEN 5-325 MG PO TABS
1.0000 | ORAL_TABLET | ORAL | Status: DC | PRN
  Administered 2020-10-07 – 2020-10-08 (×5): 2 via ORAL
  Filled 2020-10-07 (×5): qty 2

## 2020-10-07 MED ORDER — INSULIN ASPART 100 UNIT/ML ~~LOC~~ SOLN
0.0000 [IU] | Freq: Three times a day (TID) | SUBCUTANEOUS | Status: DC
  Administered 2020-10-08: 2 [IU] via SUBCUTANEOUS

## 2020-10-07 MED ORDER — POLYETHYLENE GLYCOL 3350 17 G PO PACK: 17.0000 g | PACK | Freq: Every day | ORAL | Status: DC | PRN

## 2020-10-07 MED ORDER — MIDAZOLAM HCL 2 MG/2ML IJ SOLN
INTRAMUSCULAR | Status: AC
  Filled 2020-10-07: qty 2

## 2020-10-07 MED ORDER — DEXMEDETOMIDINE (PRECEDEX) IN NS 20 MCG/5ML (4 MCG/ML) IV SYRINGE
PREFILLED_SYRINGE | INTRAVENOUS | Status: AC
  Filled 2020-10-07: qty 5

## 2020-10-07 MED ORDER — METHOCARBAMOL 500 MG IVPB - SIMPLE MED
INTRAVENOUS | Status: AC
  Filled 2020-10-07: qty 50

## 2020-10-07 MED ORDER — BISACODYL 10 MG RE SUPP: 10.0000 mg | Freq: Every day | RECTAL | Status: DC | PRN

## 2020-10-07 MED ORDER — 0.9 % SODIUM CHLORIDE (POUR BTL) OPTIME
TOPICAL | Status: DC | PRN
  Administered 2020-10-07: 1000 mL

## 2020-10-07 MED ORDER — LIDOCAINE 2% (20 MG/ML) 5 ML SYRINGE
INTRAMUSCULAR | Status: AC
  Filled 2020-10-07: qty 5

## 2020-10-07 MED ORDER — DIPHENHYDRAMINE HCL 50 MG/ML IJ SOLN
INTRAMUSCULAR | Status: DC | PRN
  Administered 2020-10-07: 6.25 mg via INTRAVENOUS

## 2020-10-07 MED ORDER — ORAL CARE MOUTH RINSE: 15.0000 mL | Freq: Once | OROMUCOSAL | Status: AC

## 2020-10-07 MED ORDER — ONDANSETRON HCL 4 MG PO TABS: 4.0000 mg | ORAL_TABLET | Freq: Four times a day (QID) | ORAL | Status: DC | PRN

## 2020-10-07 MED ORDER — PROPOFOL 10 MG/ML IV BOLUS
INTRAVENOUS | Status: DC | PRN
  Administered 2020-10-07: 200 mg via INTRAVENOUS

## 2020-10-07 MED ORDER — EPHEDRINE 5 MG/ML INJ
INTRAVENOUS | Status: AC
  Filled 2020-10-07: qty 10

## 2020-10-07 MED ORDER — ACETAMINOPHEN 500 MG PO TABS: 1000.0000 mg | ORAL_TABLET | Freq: Once | ORAL | Status: DC

## 2020-10-07 MED ORDER — AMIODARONE HCL 200 MG PO TABS
200.0000 mg | ORAL_TABLET | Freq: Every day | ORAL | Status: DC
  Administered 2020-10-08: 200 mg via ORAL
  Filled 2020-10-07: qty 1

## 2020-10-07 MED ORDER — EPHEDRINE SULFATE-NACL 50-0.9 MG/10ML-% IV SOSY
PREFILLED_SYRINGE | INTRAVENOUS | Status: DC | PRN
  Administered 2020-10-07: 10 mg via INTRAVENOUS
  Administered 2020-10-07 (×2): 15 mg via INTRAVENOUS
  Administered 2020-10-07 (×2): 10 mg via INTRAVENOUS

## 2020-10-07 MED ORDER — DROPERIDOL 2.5 MG/ML IJ SOLN: 0.6250 mg | Freq: Once | INTRAMUSCULAR | Status: DC | PRN

## 2020-10-07 MED ORDER — ONDANSETRON HCL 4 MG/2ML IJ SOLN
INTRAMUSCULAR | Status: AC
  Filled 2020-10-07: qty 2

## 2020-10-07 MED ORDER — PHENYLEPHRINE HCL-NACL 10-0.9 MG/250ML-% IV SOLN
INTRAVENOUS | Status: DC | PRN
  Administered 2020-10-07: 30 ug/min via INTRAVENOUS

## 2020-10-07 MED ORDER — METOCLOPRAMIDE HCL 5 MG/ML IJ SOLN: 5.0000 mg | Freq: Three times a day (TID) | INTRAMUSCULAR | Status: DC | PRN

## 2020-10-07 MED ORDER — BUPIVACAINE-EPINEPHRINE (PF) 0.25% -1:200000 IJ SOLN: INTRAMUSCULAR | Status: DC | PRN

## 2020-10-07 MED ORDER — HYDROMORPHONE HCL 1 MG/ML IJ SOLN
INTRAMUSCULAR | Status: AC
  Filled 2020-10-07: qty 1

## 2020-10-07 MED ORDER — SODIUM CHLORIDE 0.9 % IV SOLN: INTRAVENOUS | Status: DC

## 2020-10-07 SURGICAL SUPPLY — 76 items
BAG DECANTER FOR FLEXI CONT (MISCELLANEOUS) ×2 IMPLANT
BAG ZIPLOCK 12X15 (MISCELLANEOUS) ×2 IMPLANT
BIT DRILL 2.8X128 (BIT) ×2 IMPLANT
BIT DRILL 200X4XGRY 5XPIN (BIT) ×1 IMPLANT
BIT DRL 200X4XGRY 5XPIN (BIT) ×1
BLADE EXTENDED COATED 6.5IN (ELECTRODE) ×2 IMPLANT
BLADE SAW SAG 73X25 THK (BLADE) ×1
BLADE SAW SGTL 73X25 THK (BLADE) ×1 IMPLANT
BLADE SURG SZ10 CARB STEEL (BLADE) ×4 IMPLANT
CATH COUDE 5CC RIBBED (CATHETERS) ×1 IMPLANT
CATH RIBBED COUDE 5CC (CATHETERS) ×1
COVER SURGICAL LIGHT HANDLE (MISCELLANEOUS) ×2 IMPLANT
COVER WAND RF STERILE (DRAPES) ×2 IMPLANT
CUP ACETBLR 54 OD PINNACLE (Hips) ×2 IMPLANT
DECANTER SPIKE VIAL GLASS SM (MISCELLANEOUS) IMPLANT
DRAPE INCISE IOBAN 66X45 STRL (DRAPES) ×2 IMPLANT
DRAPE ORTHO SPLIT 77X108 STRL (DRAPES) ×2
DRAPE POUCH INSTRU U-SHP 10X18 (DRAPES) ×2 IMPLANT
DRAPE SURG ORHT 6 SPLT 77X108 (DRAPES) ×2 IMPLANT
DRAPE U-SHAPE 47X51 STRL (DRAPES) ×2 IMPLANT
DRESSING MEPILEX FLEX 4X4 (GAUZE/BANDAGES/DRESSINGS) IMPLANT
DRILL BIT 4.0MM (BIT) ×1
DRSG AQUACEL AG ADV 3.5X10 (GAUZE/BANDAGES/DRESSINGS) ×2 IMPLANT
DRSG AQUACEL AG ADV 3.5X14 (GAUZE/BANDAGES/DRESSINGS) ×2 IMPLANT
DRSG MEPILEX FLEX 4X4 (GAUZE/BANDAGES/DRESSINGS)
DURAPREP 26ML APPLICATOR (WOUND CARE) ×2 IMPLANT
ELECT REM PT RETURN 15FT ADLT (MISCELLANEOUS) ×2 IMPLANT
EVACUATOR 1/8 PVC DRAIN (DRAIN) IMPLANT
EXTRACTOR BROKEN SCREW 4 (INSTRUMENTS) ×4 IMPLANT
EXTRACTOR BROKEN SCREW 8X05 (INSTRUMENTS) ×4 IMPLANT
FACESHIELD WRAPAROUND (MASK) ×8 IMPLANT
GAUZE SPONGE 4X4 12PLY STRL (GAUZE/BANDAGES/DRESSINGS) ×2 IMPLANT
GLOVE SRG 8 PF TXTR STRL LF DI (GLOVE) ×1 IMPLANT
GLOVE SURG ENC MOIS LTX SZ6.5 (GLOVE) ×4 IMPLANT
GLOVE SURG ENC MOIS LTX SZ8 (GLOVE) ×4 IMPLANT
GLOVE SURG UNDER POLY LF SZ7 (GLOVE) ×6 IMPLANT
GLOVE SURG UNDER POLY LF SZ8 (GLOVE) ×1
GOWN STRL REUS W/TWL LRG LVL3 (GOWN DISPOSABLE) ×4 IMPLANT
HEAD CERAMIC BIOLOX DELTA 36 6 (Hips) ×2 IMPLANT
HOLDER FOLEY CATH W/STRAP (MISCELLANEOUS) ×2 IMPLANT
IMMOBILIZER KNEE 20 (SOFTGOODS) ×2
IMMOBILIZER KNEE 20 THIGH 36 (SOFTGOODS) ×1 IMPLANT
KIT BASIN OR (CUSTOM PROCEDURE TRAY) ×2 IMPLANT
KIT TURNOVER KIT A (KITS) ×2 IMPLANT
LINER MARATHON NEUT +4X54X36 (Hips) ×2 IMPLANT
MANIFOLD NEPTUNE II (INSTRUMENTS) ×2 IMPLANT
NDL SAFETY ECLIPSE 18X1.5 (NEEDLE) ×1 IMPLANT
NEEDLE HYPO 18GX1.5 SHARP (NEEDLE) ×1
NS IRRIG 1000ML POUR BTL (IV SOLUTION) ×2 IMPLANT
PACK TOTAL JOINT (CUSTOM PROCEDURE TRAY) ×2 IMPLANT
PADDING CAST COTTON 6X4 STRL (CAST SUPPLIES) ×2 IMPLANT
PASSER SUT SWANSON 36MM LOOP (INSTRUMENTS) ×2 IMPLANT
PENCIL SMOKE EVACUATOR (MISCELLANEOUS) ×2 IMPLANT
PROTECTOR NERVE ULNAR (MISCELLANEOUS) ×2 IMPLANT
SLEEVE FEM PROX 20F LRG (Hips) ×2 IMPLANT
SLEEVE SCD COMPRESS KNEE MED (STOCKING) ×2 IMPLANT
SPONGE LAP 18X18 RF (DISPOSABLE) ×2 IMPLANT
SROM FEM STEM STD 20X15 36+12 (Hips) ×2 IMPLANT
STEM FEM SROM STD 20X15 36+12 (Hips) ×1 IMPLANT
STRIP CLOSURE SKIN 1/2X4 (GAUZE/BANDAGES/DRESSINGS) ×4 IMPLANT
SUT ETHIBOND NAB CT1 #1 30IN (SUTURE) ×4 IMPLANT
SUT MNCRL AB 4-0 PS2 18 (SUTURE) ×4 IMPLANT
SUT STRATAFIX 0 PDS 27 VIOLET (SUTURE) ×2
SUT VIC AB 1 CT1 36 (SUTURE) ×2 IMPLANT
SUT VIC AB 2-0 CT1 27 (SUTURE) ×5
SUT VIC AB 2-0 CT1 TAPERPNT 27 (SUTURE) ×5 IMPLANT
SUTURE STRATFX 0 PDS 27 VIOLET (SUTURE) ×1 IMPLANT
SYR 20ML LL LF (SYRINGE) ×2 IMPLANT
SYR 50ML LL SCALE MARK (SYRINGE) IMPLANT
TOWEL OR 17X26 10 PK STRL BLUE (TOWEL DISPOSABLE) ×4 IMPLANT
TOWEL OR NON WOVEN STRL DISP B (DISPOSABLE) ×2 IMPLANT
TRAY FOLEY MTR SLVR 14FR STAT (SET/KITS/TRAYS/PACK) ×2 IMPLANT
TRAY FOLEY MTR SLVR 16FR STAT (SET/KITS/TRAYS/PACK) ×2 IMPLANT
TUBE SUCTION HIGH CAP CLEAR NV (SUCTIONS) ×2 IMPLANT
TUBING CONNECTING 10 (TUBING) ×2 IMPLANT
WATER STERILE IRR 1000ML POUR (IV SOLUTION) ×4 IMPLANT

## 2020-10-07 NOTE — Anesthesia Postprocedure Evaluation (Signed)
Anesthesia Post Note  Patient: Mike [name]  Procedure(s) Performed: Conversion of previous hip surgery to left total hip arthroplasty-posterior approach (Left Hip)     Patient location during evaluation: PACU Anesthesia Type: General Level of consciousness: sedated and patient cooperative Pain management: pain level controlled Vital Signs Assessment: post-procedure vital signs reviewed and stable Respiratory status: spontaneous breathing Cardiovascular status: stable Anesthetic complications: no   No complications documented.  Last Vitals:  Vitals:   10/07/20 1415 10/07/20 1425  BP: 129/83 124/82  Pulse: (!) 58 61  Resp: 15 16  Temp: (!) 36.4 C 36.6 C  SpO2: 99% 97%    Last Pain:  Vitals:   10/07/20 1556  TempSrc:   PainSc: Asleep                 Lewie Loron

## 2020-10-07 NOTE — Anesthesia Procedure Notes (Signed)
Procedure Name: Intubation Date/Time: 10/07/2020 9:39 AM Performed by: West Pugh, CRNA Pre-anesthesia Checklist: Patient identified, Emergency Drugs available, Suction available, Patient being monitored and Timeout performed Patient Re-evaluated:Patient Re-evaluated prior to induction Oxygen Delivery Method: Circle system utilized Preoxygenation: Pre-oxygenation with 100% oxygen Induction Type: IV induction Ventilation: Mask ventilation without difficulty Laryngoscope Size: Mac and 4 Grade View: Grade II Tube type: Oral Tube size: 7.5 mm Number of attempts: 1 Airway Equipment and Method: Stylet Placement Confirmation: ETT inserted through vocal cords under direct vision,  positive ETCO2,  CO2 detector and breath sounds checked- equal and bilateral Secured at: 22 cm Tube secured with: Tape Dental Injury: Teeth and Oropharynx as per pre-operative assessment

## 2020-10-07 NOTE — Interval H&P Note (Signed)
History and Physical Interval Note:  10/07/2020 8:12 AM  Mike [name]  has presented today for surgery, with the diagnosis of Severe osteoarthritis left hip with retained hardware.  The various methods of treatment have been discussed with the patient and family. After consideration of risks, benefits and other options for treatment, the patient has consented to  Procedure(s) with comments: Conversion of previous hip surgery to left total hip arthroplasty-posterior approach (Left) - as a surgical intervention.  The patient's history has been reviewed, patient examined, no change in status, stable for surgery.  I have reviewed the patient's chart and labs.  Questions were answered to the patient's satisfaction.     Homero Fellers [place]

## 2020-10-07 NOTE — Transfer of Care (Signed)
Immediate Anesthesia Transfer of Care Note  Patient: Mike [name]  Procedure(s) Performed: Conversion of previous hip surgery to left total hip arthroplasty-posterior approach (Left Hip)  Patient Location: PACU  Anesthesia Type:General  Level of Consciousness: awake, drowsy and patient cooperative  Airway & Oxygen Therapy: Patient Spontanous Breathing and Patient connected to face mask oxygen  Post-op Assessment: Report given to RN and Post -op Vital signs reviewed and stable  Post vital signs: Reviewed and stable  Last Vitals:  Vitals Value Taken Time  BP 121/76 10/07/20 1300  Temp    Pulse 64 10/07/20 1304  Resp 13 10/07/20 1304  SpO2 100 % 10/07/20 1304  Vitals shown include unvalidated device data.  Last Pain:  Vitals:   10/07/20 0922  TempSrc:   PainSc: 3       Patients Stated Pain Goal: 4 (10/07/20 6384)  Complications: No complications documented.

## 2020-10-07 NOTE — Brief Op Note (Signed)
10/07/2020  12:08 PM  PATIENT:  Mike [name]  70 y.o. male  PRE-OPERATIVE DIAGNOSIS:  Severe osteoarthritis left hip with retained hardware  POST-OPERATIVE DIAGNOSIS:  Severe osteoarthritis left hip with retained hardware  PROCEDURE:  Procedure(s) with comments: Conversion of previous hip surgery to left total hip arthroplasty-posterior approach (Left) -  SURGEON:  Surgeon(s) and Role:    Ollen Gross, MD - Primary  PHYSICIAN ASSISTANT:   ASSISTANTS: Arther Abbott, PA-C   ANESTHESIA:   general  EBL:  600 mL   BLOOD ADMINISTERED:none  DRAINS: none   LOCAL MEDICATIONS USED:  MARCAINE     COUNTS:  YES  TOURNIQUET:  * No tourniquets in log *  DICTATION: .Other Dictation: Dictation Number [id number]  PLAN OF CARE: Admit for overnight observation  PATIENT DISPOSITION:  PACU - hemodynamically stable.

## 2020-10-07 NOTE — Evaluation (Signed)
Physical Therapy Evaluation Patient Details Name: Mike [name] MRN: [id number] DOB: Dec 25, 1950 Today's Date: 10/07/2020   History of Present Illness  Patient is 70 y.o. male s/p Lt THA (conversion from prior hip surgery) on 10/07/20 with PMH significant for HTN, HLD, DM, CAD, OA.    Clinical Impression  Mike [name] is a 70 y.o. male POD 0 s/p Lt THA vis posterior approach. Patient reports independence with mobility at baseline. Patient is now limited by functional impairments (see PT problem list below) and requires min-mod assist for bed mobility, transfers, and gait with RW. Patient was able to ambulate ~8 feet with RW and min assist and cues to maintain posterior hip precautions. Patient provided handout and educated on precautions and instructed in exercise to facilitate circulation to manage edema and reduce risk of DVT. Patient will benefit from continued skilled PT interventions to address impairments and progress towards PLOF. Acute PT will follow to progress mobility and stair training in preparation for safe discharge home.     Follow Up Recommendations Follow surgeon's recommendation for DC plan and follow-up therapies;Home health PT    Equipment Recommendations  None recommended by PT    Recommendations for Other Services       Precautions / Restrictions Precautions Precautions: Posterior Hip;Fall Precaution Booklet Issued: Yes (comment) Restrictions Weight Bearing Restrictions: No Other Position/Activity Restrictions: WBAT      Mobility  Bed Mobility Overal bed mobility: Needs Assistance Bed Mobility: Supine to Sit     Supine to sit: Mod assist;HOB elevated     General bed mobility comments: Min-Mod cues for safe sequencing to maintain posterior hip precautions. Min-Mod assist for Lt LE mobility to EOB and to raise trunk fully.    Transfers Overall transfer level: Needs assistance Equipment used: Rolling walker (2 wheeled) Transfers: Sit to/from Stand Sit  to Stand: Min assist;From elevated surface         General transfer comment: Cues for safe hand placement/technique with Lt LE to maintain posterior hip precautions with rising. Min assist to steady once standing. Verbal/tactile cues for safe reach back to lower while preventing >90*flexion at Lt hip.  Ambulation/Gait Ambulation/Gait assistance: Min assist Gait Distance (Feet): 8 Feet Assistive device: Rolling walker (2 wheeled) Gait Pattern/deviations: Step-to pattern;Decreased stride length;Decreased weight shift to left;Decreased stance time - left Gait velocity: decr   General Gait Details: Min cues for safe step pattern and proximity to RW. Verbal cues for precautions and to avoid Lt hip internal rotation with turning. Distance limited by pain and seated rest provided.  Stairs            Wheelchair Mobility    Modified Rankin (Stroke Patients Only)       Balance Overall balance assessment: Needs assistance Sitting-balance support: Feet supported Sitting balance-Leahy Scale: Good     Standing balance support: During functional activity;Bilateral upper extremity supported Standing balance-Leahy Scale: Fair                               Pertinent Vitals/Pain Pain Assessment: 0-10 Pain Score: 7  Pain Location: Lt hip Pain Descriptors / Indicators: Aching;Discomfort;Grimacing Pain Intervention(s): Limited activity within patient's tolerance;Repositioned;Monitored during session;Premedicated before session    Home Living Family/patient expects to be discharged to:: Private residence Living Arrangements: Spouse/significant other Available Help at Discharge: Family Type of Home: House Home Access: Stairs to enter Entrance Stairs-Rails: None;Can reach both Secretary/administrator of Steps: 3 Home Layout: One level Home  Equipment: Dan Humphreys - 2 wheels;Cane - single point;Bedside commode;Shower seat      Prior Function Level of Independence: Independent                Hand Dominance   Dominant Hand: Right    Extremity/Trunk Assessment   Upper Extremity Assessment Upper Extremity Assessment: Overall WFL for tasks assessed    Lower Extremity Assessment Lower Extremity Assessment: LLE deficits/detail LLE Deficits / Details: pt able to assist with SLR on Lt, good quad activation and dorsi/plantar flexion on Lt LE LLE: Unable to fully assess due to immobilization LLE Sensation: WNL LLE Coordination: WNL    Cervical / Trunk Assessment Cervical / Trunk Assessment: Normal  Communication   Communication: No difficulties  Cognition Arousal/Alertness: Awake/alert Behavior During Therapy: WFL for tasks assessed/performed Overall Cognitive Status: Within Functional Limits for tasks assessed                                        General Comments      Exercises Total Joint Exercises Ankle Circles/Pumps: AROM;Both;20 reps;Seated   Assessment/Plan    PT Assessment Patient needs continued PT services  PT Problem List Decreased strength;Decreased range of motion;Decreased activity tolerance;Decreased balance;Decreased mobility;Decreased knowledge of use of DME;Decreased safety awareness;Decreased knowledge of precautions;Pain       PT Treatment Interventions DME instruction;Gait training;Stair training;Functional mobility training;Therapeutic exercise;Therapeutic activities;Balance training;Neuromuscular re-education;Patient/family education    PT Goals (Current goals can be found in the Care Plan section)  Acute Rehab PT Goals Patient Stated Goal: return to independence with walking and activities like gardening PT Goal Formulation: With patient Time For Goal Achievement: 10/14/20 Potential to Achieve Goals: Good    Frequency 7X/week   Barriers to discharge        Co-evaluation               AM-PAC PT "6 Clicks" Mobility  Outcome Measure Help needed turning from your back to your side while  in a flat bed without using bedrails?: A Little Help needed moving from lying on your back to sitting on the side of a flat bed without using bedrails?: A Lot Help needed moving to and from a bed to a chair (including a wheelchair)?: A Little Help needed standing up from a chair using your arms (e.g., wheelchair or bedside chair)?: A Little Help needed to walk in hospital room?: A Little Help needed climbing 3-5 steps with a railing? : A Lot 6 Click Score: 16    End of Session Equipment Utilized During Treatment: Gait belt Activity Tolerance: Patient tolerated treatment well;Patient limited by pain Patient left: in chair;with call bell/phone within reach;with chair alarm set Nurse Communication: Mobility status;Precautions PT Visit Diagnosis: Muscle weakness (generalized) (M62.81);Difficulty in walking, not elsewhere classified (R26.2);Pain Pain - Right/Left: Left Pain - part of body: Hip    Time: 1750-1826 PT Time Calculation (min) (ACUTE ONLY): 36 min   Charges:   PT Evaluation $PT Eval Low Complexity: 1 Low PT Treatments $Gait Training: 8-22 mins        Wynn Maudlin, DPT Acute Rehabilitation Services Office [phone] Pager [phone]    Anitra Lauth 10/07/2020, 7:30 PM

## 2020-10-07 NOTE — Discharge Instructions (Signed)
Dr. Ollen Gross Total Joint Specialist [facility] [address], [address] [place], [place] [address] [phone]  POSTERIOR TOTAL HIP REPLACEMENT POSTOPERATIVE DIRECTIONS  Hip Rehabilitation, Guidelines Following Surgery  The results of a hip operation are greatly improved after range of motion and muscle strengthening exercises. Follow all safety measures which are given to protect your hip. If any of these exercises cause increased pain or swelling in your joint, decrease the amount until you are comfortable again. Then slowly increase the exercises. Call your caregiver if you have problems or questions.   PRECAUTIONS (6 WEEKS FOLLOWING SURGERY) . Do not bend your hip past a 90 degree angle . Do not cross your legs. . Don't twist your hip inwards- keep knees and toes pointed upwards   HOME CARE INSTRUCTIONS  . Remove items at home which could result in a fall. This includes throw rugs or furniture in walking pathways.   ICE to the affected hip every three hours for 30 minutes at a time and then as needed for pain and swelling.  Continue to use ice on the hip for pain and swelling from surgery. You may notice swelling that will progress down to the foot and ankle.  This is normal after surgery.  Elevate the leg when you are not up walking on it.    Continue to use the breathing machine which will help keep your temperature down.  It is common for your temperature to cycle up and down following surgery, especially at night when you are not up moving around and exerting yourself.  The breathing machine keeps your lungs expanded and your temperature down.  DIET You may resume your previous home diet once your are discharged from the hospital.  DRESSING / WOUND CARE / SHOWERING Keep the surgical dressing until follow up.  The dressing is water proof, so you can shower without any extra covering.  IF THE DRESSING FALLS OFF or the wound gets wet inside, change the dressing with  sterile gauze.  Please use good hand washing techniques before changing the dressing.  Do not use any lotions or creams on the incision until instructed by your surgeon.   You may start showering once you are discharged home but do not submerge the incision under water. Just pat the incision dry and apply a dry gauze dressing on daily. Change the surgical dressing daily and reapply a dry dressing each time.  ACTIVITY Walk with your walker as instructed. Use walker as long as suggested by your caregivers. Avoid periods of inactivity such as sitting longer than an hour when not asleep. This helps prevent blood clots.  You may resume a sexual relationship in one month or when given the OK by your doctor.  You may return to work once you are cleared by your doctor.  Do not drive a car for 6 weeks or until released by you surgeon.  Do not drive while taking narcotics.  WEIGHT BEARING Weight bearing as tolerated with assist device (walker, cane, etc) as directed, use it as long as suggested by your surgeon or therapist, typically at least 4-6 weeks.  POSTOPERATIVE CONSTIPATION PROTOCOL Constipation - defined medically as fewer than three stools per week and severe constipation as less than one stool per week.  One of the most common issues patients have following surgery is constipation.  Even if you have a regular bowel pattern at home, your normal regimen is likely to be disrupted due to multiple reasons following surgery.  Combination of anesthesia, postoperative narcotics, change in appetite and fluid intake all can affect your bowels.  In order to avoid complications following surgery, here are some recommendations in order to help you during your recovery period.  Colace (docusate) - Pick up an over-the-counter form of Colace or another stool softener and take twice a day as long as you are requiring postoperative pain medications.  Take with a full glass of water daily.  If you experience loose  stools or diarrhea, hold the colace until you stool forms back up.  If your symptoms do not get better within 1 week or if they get worse, check with your doctor.  Dulcolax (bisacodyl) - Pick up over-the-counter and take as directed by the product packaging as needed to assist with the movement of your bowels.  Take with a full glass of water.  Use this product as needed if not relieved by Colace only.   MiraLax (polyethylene glycol) - Pick up over-the-counter to have on hand.  MiraLax is a solution that will increase the amount of water in your bowels to assist with bowel movements.  Take as directed and can mix with a glass of water, juice, soda, coffee, or tea.  Take if you go more than two days without a movement. Do not use MiraLax more than once per day. Call your doctor if you are still constipated or irregular after using this medication for 7 days in a row.  If you continue to have problems with postoperative constipation, please contact the office for further assistance and recommendations.  If you experience "the worst abdominal pain ever" or develop nausea or vomiting, please contact the office immediatly for further recommendations for treatment.  ITCHING  If you experience itching with your medications, try taking only a single pain pill, or even half a pain pill at a time.  You can also use Benadryl over the counter for itching or also to help with sleep.   TED HOSE STOCKINGS Wear the elastic stockings on both legs for three weeks following surgery during the day but you may remove then at night for sleeping.  MEDICATIONS See your medication summary on the "After Visit Summary" that the nursing staff will review with you prior to discharge.  You may have some home medications which will be placed on hold until you complete the course of blood thinner medication.  It is important for you to complete the blood thinner medication as prescribed by your surgeon.  Continue your approved  medications as instructed at time of discharge.  PRECAUTIONS If you experience chest pain or shortness of breath - call 911 immediately for transfer to the hospital emergency department.  If you develop a fever greater that 101 F, purulent drainage from wound, increased redness or drainage from wound, foul odor from the wound/dressing, or calf pain - CONTACT YOUR SURGEON.                                                   FOLLOW-UP APPOINTMENTS Make sure you keep all of your appointments after your operation with your surgeon and caregivers. You should call the office at the above phone number and make an appointment for approximately two weeks after the date of your surgery or on the date instructed by your surgeon outlined in the "After Visit Summary".  POST-OPERATIVE OPIOID  TAPER INSTRUCTIONS: . It is important to wean off of your opioid medication as soon as possible. If you do not need pain medication after your surgery it is ok to stop day one. Marland Kitchen Opioids include: o Codeine, Hydrocodone(Norco, Vicodin), Oxycodone(Percocet, oxycontin) and hydromorphone amongst others.  . Long term and even short term use of opiods can cause: o Increased pain response o Dependence o Constipation o Depression o Respiratory depression o And more.  . Withdrawal symptoms can include o Flu like symptoms o Nausea, vomiting o And more . Techniques to manage these symptoms o Hydrate well o Eat regular healthy meals o Stay active o Use relaxation techniques(deep breathing, meditating, yoga) . Do Not substitute Alcohol to help with tapering . If you have been on opioids for less than two weeks and do not have pain than it is ok to stop all together.  . Plan to wean off of opioids o This plan should start within one week post op of your joint replacement. o Maintain the same interval or time between taking each dose and first decrease the dose.  o Cut the total daily intake of opioids by one tablet each  day o Next start to increase the time between doses. o The last dose that should be eliminated is the evening dose.       RANGE OF MOTION AND STRENGTHENING EXERCISES  These exercises are designed to help you keep full movement of your hip joint. Follow your caregiver's or physical therapist's instructions. Perform all exercises about fifteen times, three times per day or as directed. Exercise both hips, even if you have had only one joint replacement. These exercises can be done on a training (exercise) mat, on the floor, on a table or on a bed. Use whatever works the best and is most comfortable for you. Use music or television while you are exercising so that the exercises are a pleasant break in your day. This will make your life better with the exercises acting as a break in routine you can look forward to.  . Lying on your back, slowly slide your foot toward your buttocks, raising your knee up off the floor. Then slowly slide your foot back down until your leg is straight again.  . Lying on your back spread your legs as far apart as you can without causing discomfort.  . Lying on your side, raise your upper leg and foot straight up from the floor as far as is comfortable. Slowly lower the leg and repeat.  . Lying on your back, tighten up the muscle in the front of your thigh (quadriceps muscles). You can do this by keeping your leg straight and trying to raise your heel off the floor. This helps strengthen the largest muscle supporting your knee.  . Lying on your back, tighten up the muscles of your buttocks both with the legs straight and with the knee bent at a comfortable angle while keeping your heel on the floor.   IF YOU ARE TRANSFERRED TO A SKILLED REHAB FACILITY If the patient is transferred to a skilled rehab facility following release from the hospital, a list of the current medications will be sent to the facility for the patient to continue.  When discharged from the skilled rehab  facility, please have the facility set up the patient's Home Health Physical Therapy prior to being released. Also, the skilled facility will be responsible for providing the patient with their medications at time of release from the facility  to include their pain medication, the muscle relaxants, and their blood thinner medication. If the patient is still at the rehab facility at time of the two week follow up appointment, the skilled rehab facility will also need to assist the patient in arranging follow up appointment in our office and any transportation needs.  MAKE SURE YOU:  . Understand these instructions.  . Get help right away if you are not doing well or get worse.    Pick up stool softner and laxative for home use following surgery while on pain medications. Do not submerge incision under water. Please use good hand washing techniques while changing dressing each day. May shower starting three days after surgery. Please use a clean towel to pat the incision dry following showers. Continue to use ice for pain and swelling after surgery. Do not use any lotions or creams on the incision until instructed by your surgeon.

## 2020-10-08 ENCOUNTER — Encounter (HOSPITAL_COMMUNITY): Payer: Self-pay | Admitting: Orthopedic Surgery

## 2020-10-08 ENCOUNTER — Other Ambulatory Visit (HOSPITAL_COMMUNITY): Payer: Self-pay

## 2020-10-08 LAB — BASIC METABOLIC PANEL
Anion gap: 6 (ref 5–15)
BUN: 25 mg/dL — ABNORMAL HIGH (ref 8–23)
CO2: 22 mmol/L (ref 22–32)
Calcium: 8.4 mg/dL — ABNORMAL LOW (ref 8.9–10.3)
Chloride: 108 mmol/L (ref 98–111)
Creatinine, Ser: 1.09 mg/dL (ref 0.61–1.24)
GFR, Estimated: >60 mL/min (ref >60)
Glucose, Bld: 145 mg/dL — ABNORMAL HIGH (ref 70–99)
Potassium: 4.1 mmol/L (ref 3.5–5.1)
Sodium: 136 mmol/L (ref 135–145)

## 2020-10-08 LAB — CBC
HCT: 28.5 % — ABNORMAL LOW (ref 39.0–52.0)
Hemoglobin: 9.1 g/dL — ABNORMAL LOW (ref 13.0–17.0)
MCH: 32 pg (ref 26.0–34.0)
MCHC: 31.9 g/dL (ref 30.0–36.0)
MCV: 100.4 fL — ABNORMAL HIGH (ref 80.0–100.0)
Platelets: 235 10*3/uL (ref 150–400)
RBC: 2.84 MIL/uL — ABNORMAL LOW (ref 4.22–5.81)
RDW: 13.2 % (ref 11.5–15.5)
WBC: 13.1 10*3/uL — ABNORMAL HIGH (ref 4.0–10.5)
nRBC: 0 % (ref 0.0–0.2)

## 2020-10-08 LAB — GLUCOSE, CAPILLARY: Glucose-Capillary: 124 mg/dL — ABNORMAL HIGH (ref 70–99)

## 2020-10-08 MED ORDER — METHOCARBAMOL 500 MG PO TABS
500.0000 mg | ORAL_TABLET | Freq: Four times a day (QID) | ORAL | 0 refills | Status: DC | PRN
  Filled 2020-10-08: qty 40, 10d supply, fill #0

## 2020-10-08 MED ORDER — TRAMADOL HCL 50 MG PO TABS
50.0000 mg | ORAL_TABLET | Freq: Four times a day (QID) | ORAL | 0 refills | Status: DC | PRN
  Filled 2020-10-08: qty 40, 5d supply, fill #0

## 2020-10-08 MED ORDER — HYDROCODONE-ACETAMINOPHEN 5-325 MG PO TABS
1.0000 | ORAL_TABLET | Freq: Four times a day (QID) | ORAL | 0 refills | Status: DC | PRN
  Filled 2020-10-08: qty 42, 6d supply, fill #0

## 2020-10-08 NOTE — TOC Transition Note (Signed)
Transition of Care [facility]) - CM/SW Discharge Note  Patient Details  Name: Devell Parkerson MRN: [id number] Date of Birth: [birth year]  [facility] CM/SW Contact:  Sherie Don, LCSW Phone Number: 10/08/2020, 10:21 AM  Clinical Narrative: Patient is expected to discharge home today. CSW met with patient to review discharge plan. Patient has been referred to [facility] through [facility]. Patient has a rolling walker, 3N1, and crutches at home so there are no DME needs at this time. TOC signing off.    Final next level of care: OP Rehab Barriers to Discharge: No Barriers Identified  Patient Goals and CMS Choice Patient states their goals for this hospitalization and ongoing recovery are:: Discharge home with [facility] CMS Medicare.gov Compare Post Acute Care list provided to:: Patient Choice offered to / list presented to : NA  Discharge Plan and Services        DME Arranged: N/A DME Agency: NA  Readmission Risk Interventions No flowsheet data found.

## 2020-10-08 NOTE — Op Note (Signed)
NAMEKIMMIE, DOREN MEDICAL RECORD NO: [id number] ACCOUNT NO: [id number] DATE OF BIRTH: June 07, 1951 FACILITY: [facility] LOCATION: [id number] PHYSICIAN: Gus Rankin. Dominyck Reser, MD  Operative Report   DATE OF PROCEDURE: 10/07/2020  PREOPERATIVE DIAGNOSIS:  Osteoarthritis, left hip with retained hardware.  POSTOPERATIVE DIAGNOSIS:  Osteoarthritis, left hip with retained hardware.  PROCEDURE:  Conversion of previous hip surgery to left total hip arthroplasty.  SURGEON:  Gus Rankin. Verlia Kaney, MD  ASSISTANT:  Arther Abbott, PA-C  ANESTHESIA:  General.  ESTIMATED BLOOD LOSS:  600.  DRAINS:  None.  COMPLICATIONS:  None.  CONDITION:  Stable to recovery.  BRIEF CLINICAL NOTE:  The patient is a 70 year old male who has severe advanced end-stage osteoarthritis of both hips with significant pain, dysfunction, left worse than right.  He has had a previous blade plate placed in the left hip with retained  hardware.  He presents now for conversion of previous hip surgery to total hip arthroplasty.  DESCRIPTION OF PROCEDURE:  After successful administration of general anesthetic, the patient was placed in the right lateral decubitus position with the left side up and held with a hip positioner.  Left lower extremity was isolated from his perineum  with plastic drapes and prepped and draped in the usual sterile fashion.  A long posterolateral incision was made with a 10 blade through the subcutaneous tissue to the fascia lata, which was incised in line with the skin incision.  Sciatic nerve was  palpated and protected and the short rotators and capsule isolated off the femur.  Hip was then dislocated and relocated.  I then incised the fascia of the vastus lateralis to identify the plate laterally.  This is an old blade plate with 5 Phillips head screws coming through it.  The screws were removed, three of them being broken, two other screws were intact.  I was then  able to remove the blade plate.  The two  proximal screws were going to be in the way of the femoral stem, so I had to use trephines to drill over the screws and subsequently remove the screws.  We then addressed the acetabulum.  Acetabular retractors were placed and the labrum osteophytes were removed.  Reaming starts at 47 mm, coursing in increments of 2 to 53 mm.  A 54 mm Pinnacle acetabular shell was placed in anatomic position.  It had  excellent purchase, so we did not need any dome screws.  The permanent 36 mm neutral +4 Marathon liner was placed.  We then addressed the femur.  Femoral preparation starts with the canal finder.  I then started reaming at 10 mm, utilizing S-ROM straight reamers up to 15.5 mm.  Proximal reaming was then performed with a 20 F and the sleeve machined to a large.  A 20 F  large trial sleeve was placed with a 20 x 15 stem and a 36+12 neck matching his native anteversion.  A 36+0 head was placed and it reduced to easily 36+6, had much more appropriate soft tissue tension.  He had excellent stability with full extension,  full external rotation, 70 degrees flexion, 40 degrees adduction, 70 degrees internal rotation, 90 degrees of flexion.  By placing the left leg on top of the right, I felt as though the leg lengths were equal.  Hip was then dislocated and the trials  removed.  The permanent 20 F large sleeve was placed and then a permanent 20 x 15 stem with a 36+12 neck is placed matching his native anteversion.  A  36+6 ceramic head was placed.  Hip reduced with the same stability parameters.  Wound was copiously  irrigated with saline solution and the short rotators and capsule were reattached to the femur through drill holes with Ethibond suture.  The fascia of the vastus lateralis was closed with running #1 Vicryl.  Fascia lata was closed with a running 0  Stratafix suture.  Subcutaneous was closed with interrupted 2-0 Vicryl and subcuticular running 4-0 Monocryl.  Incisions cleaned and dried and Steri-Strips  and a bulky sterile dressing applied.  Please note that prior to closure, 30 mL of 0.25% Marcaine  with epinephrine injected into the subcutaneous tissues around the incision.  Incisions were cleaned and dried.  Steri-Strips and bulky sterile dressing applied.  He was placed into a knee immobilizer, awakened, and transported to recovery in stable  condition.  Note that surgical assistance was of medical necessity for this procedure to do it in a safe and expeditious manner.  Surgical assistance was necessary for retraction of vital ligaments and neurovascular structures and for proper positioning of the limb,  for safe removal of the old implant and safe and accurate placement of the new implant.   SHW D: 10/07/2020 12:15:25 pm T: 10/08/2020 5:43:00 am  JOB: [id number]/ [id number]

## 2020-10-08 NOTE — Progress Notes (Signed)
Physical Therapy Treatment Patient Details Name: Mike [name] MRN: [id number] DOB: 03-11-1951 Today's Date: 10/08/2020    History of Present Illness Patient is 70 y.o. male s/p Lt THA (conversion from prior hip surgery) on 10/07/20 with PMH significant for HTN, HLD, DM, CAD, OA.    PT Comments    Pt is POD # 1 and is progressing well.  Pt demonstrates safe gait & transfers in order to return home from PT perspective once discharged by MD.  Pt recalling 2/3 hip precautions verbally but following well with activity.  Pt's wife was able to provide all hip precautions and provided cues when needed to pt. While in hospital, will continue to benefit from PT for skilled therapy to advance mobility and exercises.      Follow Up Recommendations  Follow surgeon's recommendation for DC plan and follow-up therapies     Equipment Recommendations  None recommended by PT (has DME)    Recommendations for Other Services       Precautions / Restrictions Precautions Precautions: Posterior Hip;Fall Precaution Booklet Issued: Yes (comment) Precaution Comments: Provided posterior precaution handout Restrictions Other Position/Activity Restrictions: WBAT    Mobility  Bed Mobility Overal bed mobility: Needs Assistance Bed Mobility: Supine to Sit     Supine to sit: Supervision;HOB elevated     General bed mobility comments: Educated on use of gait belt to assist L LE and pt able to perform without assist.  Educated on maintaining precaution and pt able to do so.    Transfers Overall transfer level: Needs assistance Equipment used: Rolling walker (2 wheeled) Transfers: Sit to/from Stand Sit to Stand: Min guard;Supervision         General transfer comment: Min guard progressed to supervision; performed x 4 throughout session with pt doing well to maintain hip precautions when rising.  Ambulation/Gait Ambulation/Gait assistance: Min guard;Supervision Gait Distance (Feet): 100 Feet  (100'x2) Assistive device: Rolling walker (2 wheeled) Gait Pattern/deviations: Step-to pattern;Decreased stride length;Decreased weight shift to left Gait velocity: decr   General Gait Details: Min guard progressed to supervision; cues for RW proximity and posture; educated on setting RW height. Educated on preventing L hip internal rotation with turning (if making sharp turn should turn toward L side)   Stairs Stairs: Yes Stairs assistance: Min guard Stair Management: Two rails;One rail Left;Step to pattern;Forwards Number of Stairs: 6 General stair comments: Started with 2 rails and then had pt try 1 rail but with hand forward to mimic pole and HHA on L.  Pt reports uses front steps typically -there are 3 and a pole to grab on R but no rail.  Back steps - there ar 4 with bil rails and can drive to back.  Advised using back steps.  Pt agreeable after trying with 1 rail hand in front to mimic pole.   Wheelchair Mobility    Modified Rankin (Stroke Patients Only)       Balance Overall balance assessment: Needs assistance Sitting-balance support: Feet supported Sitting balance-Leahy Scale: Good Sitting balance - Comments: steady; good posture and maintenance of hip precautions   Standing balance support: During functional activity;Bilateral upper extremity supported;No upper extremity supported Standing balance-Leahy Scale: Fair Standing balance comment: RW for ambulation but able to static stand briefly                            Cognition Arousal/Alertness: Awake/alert Behavior During Therapy: WFL for tasks assessed/performed Overall Cognitive Status: Within Functional Limits  for tasks assessed                                 General Comments: Overall at baseline.  Asked hip precautions 2 x during session and pt recalling 2/3 precautions both times (forgot no IR initially, then forgot no ADD); wife was able to provide missing precaution and assist with  cueing. Pt reports forgetful at baseline      Exercises Total Joint Exercises Ankle Circles/Pumps: AROM;Both;20 reps;Seated Quad Sets: AROM;Both;10 reps;Supine Heel Slides: AAROM;10 reps;Supine;Left Hip ABduction/ADduction: AAROM;Left;10 reps;Supine Long Arc Quad: AROM;Left;10 reps;Supine Other Exercises Other Exercises: Educated on AAROM techniques using gait belt and limited motion to maintain precautions/pain control    General Comments   Educated on safe ice use, no pivots, car transfers, shower transfers, and TED hose during day. Increased time spent educating on posterior precautions and how to follow with transfers. Pt has knee immobilizer in room, advised wearing when sleeping to help maintain precautions. Also, encouraged walking every 1-2 hours during day. Educated on HEP with focus on mobility the first weeks. Discussed doing exercises within pain control and if pain increasing could decreased ROM, reps, and stop exercises as needed. Encouraged to perform  ankle pumps frequently for blood flow.  Discussed gradual progression of activity.       Pertinent Vitals/Pain Pain Assessment: 0-10 Pain Score: 3  Pain Location: Lt hip Pain Descriptors / Indicators: Discomfort;Sore Pain Intervention(s): Limited activity within patient's tolerance;Premedicated before session;Monitored during session;Ice applied    Home Living                      Prior Function            PT Goals (current goals can now be found in the care plan section) Acute Rehab PT Goals Patient Stated Goal: return to independence with walking and activities like gardening PT Goal Formulation: With patient/family Time For Goal Achievement: 10/14/20 Potential to Achieve Goals: Good Progress towards PT goals: Progressing toward goals    Frequency    7X/week      PT Plan Current plan remains appropriate    Co-evaluation              AM-PAC PT "6 Clicks" Mobility   Outcome Measure   Help needed turning from your back to your side while in a flat bed without using bedrails?: A Little Help needed moving from lying on your back to sitting on the side of a flat bed without using bedrails?: A Little Help needed moving to and from a bed to a chair (including a wheelchair)?: A Little Help needed standing up from a chair using your arms (e.g., wheelchair or bedside chair)?: A Little Help needed to walk in hospital room?: A Little Help needed climbing 3-5 steps with a railing? : A Little 6 Click Score: 18    End of Session   Activity Tolerance: Patient tolerated treatment well Patient left: in chair;with call bell/phone within reach;with chair alarm set;with family/visitor present Nurse Communication: Mobility status PT Visit Diagnosis: Muscle weakness (generalized) (M62.81);Difficulty in walking, not elsewhere classified (R26.2);Pain Pain - Right/Left: Left Pain - part of body: Hip     Time: 1112-1200 PT Time Calculation (min) (ACUTE ONLY): 48 min  Charges:  $Gait Training: 8-22 mins $Therapeutic Exercise: 8-22 mins $Therapeutic Activity: 8-22 mins  Anise Salvo, PT Acute Rehab Services Pager [phone] [facility] [phone]     Rayetta Humphrey 10/08/2020, 12:37 PM

## 2020-10-08 NOTE — Progress Notes (Signed)
   Subjective: 1 Day Post-Op Procedure(s) (LRB): Conversion of previous hip surgery to left total hip arthroplasty-posterior approach (Left) Patient reports pain as mild.   Patient seen in rounds by Dr. Lequita [name]. Patient is well, and has had no acute complaints or problems. No acute overnight events. Denies SOB, chest pain, or calf pain.  We will continue therapy today. Ambulated around 8 feet yesterday with PT.   Objective: Vital signs in last 24 hours: Temp:  [97.5 F (36.4 C)-98.2 F (36.8 C)] 98 F (36.7 C) (04/28 0559) Pulse Rate:  [58-69] 68 (04/28 0559) Resp:  [10-17] 16 (04/28 0559) BP: (105-141)/(63-89) 141/85 (04/28 0559) SpO2:  [93 %-100 %] 99 % (04/28 0559)  Intake/Output from previous day:  Intake/Output Summary (Last 24 hours) at 10/08/2020 0840 Last data filed at 10/08/2020 0522 Gross per 24 hour  Intake 3368.16 ml  Output 2450 ml  Net 918.16 ml     Intake/Output this shift: No intake/output data recorded.  Labs: Recent Labs    10/08/20 0334  HGB 9.1*   Recent Labs    10/08/20 0334  WBC 13.1*  RBC 2.84*  HCT 28.5*  PLT 235   Recent Labs    10/08/20 0334  NA 136  K 4.1  CL 108  CO2 22  BUN 25*  CREATININE 1.09  GLUCOSE 145*  CALCIUM 8.4*   Recent Labs    10/07/20 0810  INR 1.0    Exam: General - Patient is Alert and Oriented Extremity - Neurologically intact Neurovascular intact Intact pulses distally Dorsiflexion/Plantar flexion intact Dressing - dressing C/D/I Motor Function - intact, moving foot and toes well on exam.   Past Medical History:  Diagnosis Date  . Arthritis    hip, hands thumbs  . Coronary artery disease   . Diabetes mellitus without complication (HCC)   . Dysrhythmia 2012   A-Fib  . History of kidney stones   . Hyperlipidemia   . Hypertension     Assessment/Plan: 1 Day Post-Op Procedure(s) (LRB): Conversion of previous hip surgery to left total hip arthroplasty-posterior approach (Left) Principal  Problem:   OA (osteoarthritis) of hip Active Problems:   S/P total left hip arthroplasty  Estimated body mass index is 24.91 kg/m as calculated from the following:   Height as of this encounter: 6\' 2"  (1.88 m).   Weight as of this encounter: 88 kg. Up with therapy  DVT Prophylaxis - Xarelto and TED hose Weight bearing as tolerated D/C knee immobilizer Hemovac pulled without difficulty Begin therapy Hip precautions discussed with patient  Plan is to go Home after hospital stay.  Plan for two sessions of PT today, and if meeting goals, will plan for discharge this afternoon.   Patient to follow up in two weeks with Dr. in clinic.   The PDMP database was reviewed today prior to any opioid medications being prescribed to this patient.  Mike [name], MBA, PA-C Orthopedic Surgery 10/08/2020, 8:40 AM

## 2020-10-08 NOTE — Progress Notes (Signed)
Patient discharged to home w/ wife. Given all belongings, instructions. Verbalized understanding of instructions. Escorted to pov via wc. 

## 2020-10-11 LAB — TYPE AND SCREEN
ABO/RH(D): A POS
Antibody Screen: NEGATIVE
Unit division: 0
Unit division: 0

## 2020-10-11 LAB — BPAM RBC
Blood Product Expiration Date: 202205172359
Blood Product Expiration Date: 202205172359
Unit Type and Rh: 6200
Unit Type and Rh: 6200

## 2020-10-14 NOTE — Discharge Summary (Signed)
Physician Discharge Summary   Patient ID: Mike [name] MRN: [id number] DOB/AGE: [birth year] 70 y.o.  Admit date: 10/07/2020 Discharge date: 10/08/2020  Primary Diagnosis:  Osteoarthritis, left hip with retained hardware  Admission Diagnoses:  Past Medical History:  Diagnosis Date  . Arthritis    hip, hands thumbs  . Coronary artery disease   . Diabetes mellitus without complication (HCC)   . Dysrhythmia 2012   A-Fib  . History of kidney stones   . Hyperlipidemia   . Hypertension    Discharge Diagnoses:   Principal Problem:   OA (osteoarthritis) of hip Active Problems:   S/P total left hip arthroplasty  Estimated body mass index is 24.91 kg/m as calculated from the following:   Height as of this encounter: 6\' 2"  (1.88 m).   Weight as of this encounter: 88 kg.  Procedure:  Procedure(s) (LRB): Conversion of previous hip surgery to left total hip arthroplasty-posterior approach (Left)   Consults: None  HPI: The patient is a 70 year old male who has severe advanced end-stage osteoarthritis of both hips with significant pain, dysfunction, left worse than right.  He has had a previous blade plate placed in the left hip with retained  hardware.  He presents now for conversion of previous hip surgery to total hip arthroplasty.  Laboratory Data: Admission on 10/07/2020, Discharged on 10/08/2020  Component Date Value Ref Range Status  . ABO/RH(D) 10/07/2020    Final                   Value:A POS Performed at [facility], [address] [place], [place]   . Prothrombin Time 10/07/2020 13.3  11.4 - 15.2 seconds Final  . INR 10/07/2020 1.0  0.8 - 1.2 Final   Comment: (NOTE) INR goal varies based on device and disease states. Performed at [facility], [address], [place], [place]   . aPTT 10/07/2020 36  24 - 36 seconds Final   Performed at [facility], [address], [place], [place]  [place]  . Glucose-Capillary 10/07/2020 106* 70 - 99 mg/dL Final   Glucose reference range applies only to samples taken after fasting for at least 8 hours.  . Glucose-Capillary 10/07/2020 106* 70 - 99 mg/dL Final   Glucose reference range applies only to samples taken after fasting for at least 8 hours.  . Glucose-Capillary 10/07/2020 120* 70 - 99 mg/dL Final   Glucose reference range applies only to samples taken after fasting for at least 8 hours.  . WBC 10/08/2020 13.1* 4.0 - 10.5 K/uL Final  . RBC 10/08/2020 2.84* 4.22 - 5.81 MIL/uL Final  . Hemoglobin 10/08/2020 9.1* 13.0 - 17.0 g/dL Final  . HCT 10/10/2020 28.5* 39.0 - 52.0 % Final  . MCV 10/08/2020 100.4* 80.0 - 100.0 fL Final  . MCH 10/08/2020 32.0  26.0 - 34.0 pg Final  . MCHC 10/08/2020 31.9  30.0 - 36.0 g/dL Final  . RDW 10/10/2020 13.2  11.5 - 15.5 % Final  . Platelets 10/08/2020 235  150 - 400 K/uL Final  . nRBC 10/08/2020 0.0  0.0 - 0.2 % Final   Performed at [facility], [address], [place], [place]  . Sodium 10/08/2020 136  135 - 145 mmol/L Final  . Potassium 10/08/2020 4.1  3.5 - 5.1 mmol/L Final  . Chloride 10/08/2020 108  98 - 111 mmol/L Final  . CO2 10/08/2020 22  22 - 32 mmol/L Final  .  Glucose, Bld 10/08/2020 145* 70 - 99 mg/dL Final   Glucose reference range applies only to samples taken after fasting for at least 8 hours.  . BUN 10/08/2020 25* 8 - 23 mg/dL Final  . Creatinine, Ser 10/08/2020 1.09  0.61 - 1.24 mg/dL Final  . Calcium 69/62/9528 8.4* 8.9 - 10.3 mg/dL Final  . GFR, Estimated 10/08/2020 >60  >60 mL/min Final   Comment: (NOTE) Calculated using the CKD-EPI Creatinine Equation (2021)   . Anion gap 10/08/2020 6  5 - 15 Final   Performed at [facility], [address] [place], [place] [address]  . Glucose-Capillary 10/07/2020 218* 70 - 99 mg/dL Final   Glucose reference range applies only to samples taken after fasting for at least 8 hours.  .  Glucose-Capillary 10/08/2020 124* 70 - 99 mg/dL Final   Glucose reference range applies only to samples taken after fasting for at least 8 hours.  Hospital Outpatient Visit on 10/05/2020  Component Date Value Ref Range Status  . SARS Coronavirus 2 10/05/2020 NEGATIVE  NEGATIVE Final   Comment: (NOTE) SARS-CoV-2 target nucleic acids are NOT DETECTED.  The SARS-CoV-2 RNA is generally detectable in upper and lower respiratory specimens during the acute phase of infection. Negative results do not preclude SARS-CoV-2 infection, do not rule out co-infections with other pathogens, and should not be used as the sole basis for treatment or other patient management decisions. Negative results must be combined with clinical observations, patient history, and epidemiological information. The expected result is Negative.  Fact Sheet for Patients: HairSlick.no  Fact Sheet for Healthcare Providers: quierodirigir.com  This test is not yet approved or cleared by the [place] FDA and  has been authorized for detection and/or diagnosis of SARS-CoV-2 by FDA under an Emergency Use Authorization (EUA). This EUA will remain  in effect (meaning this test can be used) for the duration of the COVID-19 declaration under Se                          ction 564(b)(1) of the Act, 21 U.S.C. section 360bbb-3(b)(1), unless the authorization is terminated or revoked sooner.  Performed at [facility], [address], [place], [place] [address]   Hospital Outpatient Visit on 09/28/2020  Component Date Value Ref Range Status  . Hgb A1c MFr Bld 09/28/2020 5.5  4.8 - 5.6 % Final   Comment: (NOTE)         Prediabetes: 5.7 - 6.4         Diabetes: >6.4         Glycemic control for adults with diabetes: <7.0   . Mean Plasma Glucose 09/28/2020 111  mg/dL Final   Comment: (NOTE) Performed At: [facility] [address] [place], [place]  [id number] Jolene Schimke MD [id number]   . WBC 09/28/2020 12.4* 4.0 - 10.5 K/uL Final  . RBC 09/28/2020 3.49* 4.22 - 5.81 MIL/uL Final  . Hemoglobin 09/28/2020 11.5* 13.0 - 17.0 g/dL Final  . HCT 56/43/3295 35.2* 39.0 - 52.0 % Final  . MCV 09/28/2020 100.9* 80.0 - 100.0 fL Final  . MCH 09/28/2020 33.0  26.0 - 34.0 pg Final  . MCHC 09/28/2020 32.7  30.0 - 36.0 g/dL Final  . RDW 18/84/1660 14.6  11.5 - 15.5 % Final  . Platelets 09/28/2020 267  150 - 400 K/uL Final  . nRBC 09/28/2020 0.0  0.0 - 0.2 % Final   Performed at [facility], [address] [place], [place] [address]  . Sodium 09/28/2020 141  135 - 145 mmol/L Final  . Potassium 09/28/2020 4.3  3.5 - 5.1 mmol/L Final  . Chloride 09/28/2020 107  98 - 111 mmol/L Final  . CO2 09/28/2020 25  22 - 32 mmol/L Final  . Glucose, Bld 09/28/2020 124* 70 - 99 mg/dL Final   Glucose reference range applies only to samples taken after fasting for at least 8 hours.  . BUN 09/28/2020 28* 8 - 23 mg/dL Final  . Creatinine, Ser 09/28/2020 1.32* 0.61 - 1.24 mg/dL Final  . Calcium 78/29/562104/18/2022 8.9  8.9 - 10.3 mg/dL Final  . Total Protein 09/28/2020 6.9  6.5 - 8.1 g/dL Final  . Albumin 30/86/578404/18/2022 3.6  3.5 - 5.0 g/dL Final  . AST 69/62/952804/18/2022 25  15 - 41 U/L Final  . ALT 09/28/2020 22  0 - 44 U/L Final  . Alkaline Phosphatase 09/28/2020 69  38 - 126 U/L Final  . Total Bilirubin 09/28/2020 0.5  0.3 - 1.2 mg/dL Final  . GFR, Estimated 09/28/2020 59* >60 mL/min Final   Comment: (NOTE) Calculated using the CKD-EPI Creatinine Equation (2021)   . Anion gap 09/28/2020 9  5 - 15 Final   Performed at [facility], [address] [place], [place] [address]  . Prothrombin Time 09/28/2020 33.1* 11.4 - 15.2 seconds Final  . INR 09/28/2020 3.2* 0.8 - 1.2 Final   Comment: (NOTE) INR goal varies based on device and disease states. Performed at [facility], [address] [place], [place] [address]    . aPTT 09/28/2020 63* 24 - 36 seconds Final   Comment:        IF BASELINE aPTT IS ELEVATED, SUGGEST PATIENT RISK ASSESSMENT BE USED TO DETERMINE APPROPRIATE ANTICOAGULANT THERAPY. Performed at [facility], [address] [place], [place] [address]   . ABO/RH(D) 09/28/2020 A POS   Final  . Antibody Screen 09/28/2020 NEG   Final  . Sample Expiration 09/28/2020 10/10/2020,2359   Final  . Extend sample reason 09/28/2020 NO TRANSFUSIONS OR PREGNANCY IN THE PAST 3 MONTHS   Final  . Unit Number 09/28/2020 [id number]   Final  . Blood Component Type 09/28/2020 RBC LR PHER2   Final  . Unit division 09/28/2020 00   Final  . Status of Unit 09/28/2020 [facility]   Final  . Transfusion Status 09/28/2020 OK TO TRANSFUSE   Final  . Crossmatch Result 09/28/2020 Compatible   Final  . Unit Number 09/28/2020 [id number]   Final  . Blood Component Type 09/28/2020 RED CELLS,LR   Final  . Unit division 09/28/2020 00   Final  . Status of Unit 09/28/2020 REL [facility]   Final  . Transfusion Status 09/28/2020 OK TO TRANSFUSE   Final  . Crossmatch Result 09/28/2020    Final                   Value:Compatible Performed at [facility], [address] [place], [place] [address]   . MRSA, PCR 09/28/2020 NEGATIVE  NEGATIVE Final  . Staphylococcus aureus 09/28/2020 NEGATIVE  NEGATIVE Final   Comment: (NOTE) The Xpert SA Assay (FDA approved for NASAL specimens in patients 69 years of age and older), is one component of a comprehensive surveillance program. It is not intended to diagnose infection nor to guide or monitor treatment. Performed at [facility], [address] [place], [place] [address]   .  Glucose-Capillary 09/28/2020 124* 70 - 99 mg/dL Final   Glucose reference range applies only to samples taken after fasting for at least 8 hours.  . Blood Product Unit Number 09/28/2020 [id number]   Final  . PRODUCT CODE 09/28/2020  [id number]   Final  . Unit Type and Rh 09/28/2020 6200   Final  . Blood Product Expiration Date 09/28/2020 [id number]   Final  . Blood Product Unit Number 09/28/2020 [id number]   Final  . PRODUCT CODE 09/28/2020 [id number]   Final  . Unit Type and Rh 09/28/2020 6200   Final  . Blood Product Expiration Date 09/28/2020 [id number]   Final     X-Rays:DG Pelvis Portable  Result Date: 10/07/2020 CLINICAL DATA:  Hip replacement. EXAM: PORTABLE PELVIS 1-2 VIEWS COMPARISON:  No prior. FINDINGS: Total left hip replacement. Hardware intact. Anatomic alignment. Wire fragments noted adjacent to the left hip. Catheter noted over the bladder. IMPRESSION: Total left hip replacement with anatomic alignment. Electronically Signed   By: Maisie Fus  Register   On: 10/07/2020 13:38    EKG: Orders placed or performed during the hospital encounter of 10/07/20  . EKG 12-Lead  . EKG 12-Lead  . EKG 12-Lead  . EKG 12-Lead     Hospital Course: Mike [name] is a 70 y.o. who was admitted to [facility]. They were brought to the operating room on 10/07/2020 and underwent Procedure(s): Conversion of previous hip surgery to left total hip arthroplasty-posterior approach.  Patient tolerated the procedure well and was later transferred to the recovery room and then to the orthopaedic floor for postoperative care. They were given PO and IV analgesics for pain control following their surgery. They were given 24 hours of postoperative antibiotics of  Anti-infectives (From admission, onward)   Start     Dose/Rate Route Frequency Ordered Stop   10/07/20 1600  ceFAZolin (ANCEF) IVPB 2g/100 mL premix        2 g 200 mL/hr over 30 Minutes Intravenous Every 6 hours 10/07/20 1430 10/07/20 2024   10/07/20 0815  ceFAZolin (ANCEF) IVPB 2g/100 mL premix        2 g 200 mL/hr over 30 Minutes Intravenous On call to O.R. 10/07/20 3557 10/07/20 1010     and started on DVT prophylaxis in the form of Xarelto and TED hose.   PT and  OT were ordered for total joint protocol. Discharge planning consulted to help with postop disposition and equipment needs.  Patient had an uneventful night on the evening of surgery. They started to get up OOB with therapy on 10/08/20. Pt was seen during rounds and was ready to go home pending progress with therapy. He worked with therapy on POD #1 and was meeting goals. Pt was discharged to home later that day in stable condition.  Diet: Regular diet Activity: WBAT Follow-up: in two weeks Disposition: Home Discharged Condition: good   Discharge Instructions    Call MD / Call 911   Complete by: As directed    If you experience chest pain or shortness of breath, CALL 911 and be transported to the hospital emergency room.  If you develope a fever above 101 F, pus (white drainage) or increased drainage or redness at the wound, or calf pain, call your surgeon's office.   Change dressing   Complete by: As directed    You have an adhesive waterproof bandage over the incision. Leave this in place until your first follow-up appointment. Once you remove this you will not need to place  another bandage.   Constipation Prevention   Complete by: As directed    Drink plenty of fluids.  Prune juice may be helpful.  You may use a stool softener, such as Colace (over the counter) 100 mg twice a day.  Use MiraLax (over the counter) for constipation as needed.   Diet - low sodium heart healthy   Complete by: As directed    Do not sit on low chairs, stoools or toilet seats, as it may be difficult to get up from low surfaces   Complete by: As directed    Driving restrictions   Complete by: As directed    No driving for two weeks   Post-operative opioid taper instructions:   Complete by: As directed    POST-OPERATIVE OPIOID TAPER INSTRUCTIONS: It is important to wean off of your opioid medication as soon as possible. If you do not need pain medication after your surgery it is ok to stop day one. Opioids  include: Codeine, Hydrocodone(Norco, Vicodin), Oxycodone(Percocet, oxycontin) and hydromorphone amongst others.  Long term and even short term use of opiods can cause: Increased pain response Dependence Constipation Depression Respiratory depression And more.  Withdrawal symptoms can include Flu like symptoms Nausea, vomiting And more Techniques to manage these symptoms Hydrate well Eat regular healthy meals Stay active Use relaxation techniques(deep breathing, meditating, yoga) Do Not substitute Alcohol to help with tapering If you have been on opioids for less than two weeks and do not have pain than it is ok to stop all together.  Plan to wean off of opioids This plan should start within one week post op of your joint replacement. Maintain the same interval or time between taking each dose and first decrease the dose.  Cut the total daily intake of opioids by one tablet each day Next start to increase the time between doses. The last dose that should be eliminated is the evening dose.      TED hose   Complete by: As directed    Use stockings (TED hose) for three weeks on both leg(s).  You may remove them at night for sleeping.   Weight bearing as tolerated   Complete by: As directed      Allergies as of 10/08/2020   No Known Allergies     Medication List    STOP taking these medications   aspirin EC 81 MG tablet   ibuprofen 800 MG tablet Commonly known as: ADVIL     TAKE these medications   amiodarone 200 MG tablet Commonly known as: PACERONE Take 200 mg by mouth in the morning.   B-complex with vitamin C tablet Take 1 tablet by mouth in the morning.   carvedilol 12.5 MG tablet Commonly known as: COREG Take 12.5 mg by mouth in the morning and at bedtime.   Cholecalciferol 25 MCG (1000 UT) tablet Take 1,000 Units by mouth in the morning.   gabapentin 300 MG capsule Commonly known as: NEURONTIN Take 600 mg by mouth 2 (two) times daily.    HYDROcodone-acetaminophen 5-325 MG tablet Commonly known as: NORCO/VICODIN Take 1-2 tablets by mouth every 6 (six) hours as needed for severe pain.   losartan 100 MG tablet Commonly known as: COZAAR Take 100 mg by mouth in the morning.   metFORMIN 500 MG tablet Commonly known as: GLUCOPHAGE Take 500 mg by mouth in the morning and at bedtime.   methocarbamol 500 MG tablet Commonly known as: ROBAXIN Take 1 tablet (500 mg total) by mouth every  6 (six) hours as needed for muscle spasms.   multivitamin with minerals Tabs tablet Take 1 tablet by mouth in the morning.   Muscle Rub 10-15 % Crea Apply 1 application topically 4 (four) times daily as needed for muscle pain.   pravastatin 20 MG tablet Commonly known as: PRAVACHOL Take 20 mg by mouth at bedtime.   rivaroxaban 20 MG Tabs tablet Commonly known as: XARELTO Take 20 mg by mouth in the morning.   SELENIMIN-200 PO Take 200 mcg by mouth in the morning.   traMADol 50 MG tablet Commonly known as: ULTRAM Take 1-2 tablets (50-100 mg total) by mouth every 6 (six) hours as needed for moderate pain.   vitamin C 1000 MG tablet Take 1,000 mg by mouth in the morning.   Zinc 30 MG Tabs Take 30 mg by mouth in the morning.            Discharge Care Instructions  (From admission, onward)         Start     Ordered   10/08/20 0000  Weight bearing as tolerated        10/08/20 0845   10/08/20 0000  Change dressing       Comments: You have an adhesive waterproof bandage over the incision. Leave this in place until your first follow-up appointment. Once you remove this you will not need to place another bandage.   10/08/20 0845          Follow-up Information    Ollen Gross, MD. Schedule an appointment as soon as possible for a visit on 10/20/2020.   Specialty: Orthopedic Surgery Contact information: [address] [place] [address] [phone]               Signed: Nelia Shi, MBA,  PA-C Orthopedic Surgery 10/14/2020, 12:04 PM

## 2022-01-11 ENCOUNTER — Encounter: Payer: Self-pay | Admitting: Physical Therapy

## 2022-01-11 ENCOUNTER — Other Ambulatory Visit: Payer: Self-pay

## 2022-01-11 ENCOUNTER — Ambulatory Visit: Payer: Medicare Other | Attending: Physician Assistant | Admitting: Physical Therapy

## 2022-01-11 DIAGNOSIS — R29898 Other symptoms and signs involving the musculoskeletal system: Secondary | ICD-10-CM | POA: Diagnosis present

## 2022-01-11 DIAGNOSIS — M6281 Muscle weakness (generalized): Secondary | ICD-10-CM | POA: Diagnosis present

## 2022-01-11 DIAGNOSIS — M25552 Pain in left hip: Secondary | ICD-10-CM | POA: Diagnosis present

## 2022-01-11 DIAGNOSIS — R262 Difficulty in walking, not elsewhere classified: Secondary | ICD-10-CM | POA: Insufficient documentation

## 2022-01-11 DIAGNOSIS — M25652 Stiffness of left hip, not elsewhere classified: Secondary | ICD-10-CM | POA: Insufficient documentation

## 2022-01-11 NOTE — Therapy (Addendum)
OUTPATIENT PHYSICAL THERAPY LOWER EXTREMITY EVALUATION   Patient Name: Mike [name] MRN: [id number] DOB:1951/01/06, 71 y.o., male Today's Date: 01/11/2022   PT End of Session - 01/11/22 1534     Visit Number 1    Number of Visits 7    Date for PT Re-Evaluation 02/04/22    Authorization Type [employer]    PT Start Time 1534    PT Stop Time 1620    PT Time Calculation (min) 46 min    Activity Tolerance Patient tolerated treatment well    Behavior During Therapy [facility] for tasks assessed/performed             Past Medical History:  Diagnosis Date   Arthritis    hip, hands thumbs   Coronary artery disease    Diabetes mellitus without complication (HCC)    Dysrhythmia 2012   A-Fib   History of kidney stones    Hyperlipidemia    Hypertension    Past Surgical History:  Procedure Laterality Date   TOTAL HIP ARTHROPLASTY Left 10/07/2020   Procedure: Conversion of previous hip surgery to left total hip arthroplasty-posterior approach;  Surgeon: Ollen Gross, MD;  Location: [facility];  Service: Orthopedics;  Laterality: Left;    Patient Active Problem List   Diagnosis Date Noted   OA (osteoarthritis) of hip 10/07/2020   S/P total left hip arthroplasty 10/07/2020    PCP: Ignacia Palma., MD  REFERRING PROVIDER: Malvin Johns  REFERRING DIAG: [phone] (ICD-10-CM) - Presence of unspecified artificial hip joint  THERAPY DIAG:  Pain in left hip  Stiffness of left hip, not elsewhere classified  Muscle weakness (generalized)  Other symptoms and signs involving the musculoskeletal system  Difficulty in walking, not elsewhere classified  RATIONALE FOR EVALUATION AND TREATMENT: Rehabilitation  ONSET DATE:  ~May 2022  NEXT MD VISIT:  none scheduled   SUBJECTIVE:   SUBJECTIVE STATEMENT: Pt reports onset of lateral L hip pain ~1 yr ago (~May 2022) following conversion of previous hip surgery to left total hip arthroplasty via posterior approach on  10/07/20. Received L troch bursa injection on 10/15/21 along with steroid dose pack x 2 with relief noted but pain returning after 1st dose pack wore off, but better since 2nd dose pack.  PAIN:  Are you having pain? No  PERTINENT HISTORY: 10/07/20 - Conversion of previous hip surgery to left total hip arthroplasty-posterior approach; PMH significant for TIA 01/08/21, HTN, HLD, DM, CAD, afib s/p ablation 08/18/21, pacemaker 02/22/21, OA  PRECAUTIONS: ICD/Pacemaker    WEIGHT BEARING RESTRICTIONS: No  FALLS:  Has patient fallen in last 6 months? No  LIVING ENVIRONMENT: Lives with: lives with their spouse Lives in: House/apartment Stairs: Yes: External:  2 steps; post Has following equipment at home: Single point cane and Crutches  OCCUPATION: Retired   PLOF: Independent and Leisure: walking, working in the garden  PATIENT GOALS: "To keep the pain from coming back."   OBJECTIVE:   DIAGNOSTIC FINDINGS:  N/A  PATIENT SURVEYS:  LEFS - TBA next visit FOTO - TBA next visit  COGNITION: Overall cognitive status: Within functional limits for tasks assessed     SENSATION: WFL  EDEMA:  N/A  MUSCLE LENGTH: Hamstrings: mod tight B ITB: mod tight B Piriformis: mod tight R>L Hip flexors: mod/severe tight L>R Quads: mod tight B  POSTURE:  No Significant postural limitations  PALPATION: TTP over L greater trochanter  LOWER EXTREMITY ROM:  Active ROM Right eval Left eval  Hip flexion 105 94  Hip extension  limited  Hip abduction    Hip adduction    Hip internal rotation    Hip external rotation    Knee flexion    Knee extension    Ankle dorsiflexion    Ankle plantarflexion    Ankle inversion    Ankle eversion     (Blank rows = not tested)  LOWER EXTREMITY MMT:  MMT Right eval Left eval  Hip flexion 4 4  Hip extension 4 4- Limited motion  Hip abduction 4 4-  Hip adduction 4+ 4  Hip internal rotation 5 4+  Hip external rotation 4 4-  Knee flexion 4+ 4+   Knee extension 5 5  Ankle dorsiflexion 5 5  Ankle plantarflexion    Ankle inversion    Ankle eversion     (Blank rows = not tested)  LOWER EXTREMITY SPECIAL TESTS:  Hip special tests: Ober's test: positive   GAIT: Assistive device utilized: None Level of assistance: Complete Independence Gait pattern: WFL   TODAY'S TREATMENT:  01/11/22 THERAPEUTIC EXERCISE: Instruction in initial HEP (see below) to improve flexibility, strength and mobility.  Verbal and tactile cues throughout for technique.  MODALITIES: Ionotophoresis with 1.0 mL 4mg /ml Dexamethasone - 4-6 hour patch (7mA-min) to L greater trochanter (patch #1 of 6)   PATIENT EDUCATION:  Education details: PT eval findings, anticipated POC, initial HEP, and Ionto patch wearing instructions Person educated: Patient Education method: Explanation, Demonstration, Verbal cues, and Handouts Education comprehension: verbalized understanding, returned demonstration, verbal cues required, and needs further education   HOME EXERCISE PROGRAM: Access Code: [email or web address] URL: [email or web address] Date: 01/11/2022 Prepared by: 03/13/2022  Exercises - Supine Single Knee to Chest Stretch  - 2-3 x daily - 7 x weekly - 3 reps - 30 sec hold - Hooklying Hamstring Stretch with Strap  - 2-3 x daily - 7 x weekly - 3 reps - 30 sec hold - Supine ITB Stretch with Strap  - 2-3 x daily - 7 x weekly - 3 reps - 30 sec hold - Supine Quadriceps Stretch with Strap on Table  - 2-3 x daily - 7 x weekly - 3 reps - 30 sec hold - Supine Piriformis Stretch with Foot on Ground  - 2-3 x daily - 7 x weekly - 3 reps - 30 sec hold  Patient Education - Ionto Pt Instructions - [facility]  ASSESSMENT:  CLINICAL IMPRESSION: Mike [name] is a 71 y.o. male who was seen today for physical therapy evaluation and treatment for L trochanteric bursitis s/p conversion of previous hip surgery to left total hip arthroplasty via posterior approach on 10/07/20.  He reports onset of L lateral hip pain within a month or so of surgery with pain getting so bad this spring that he found walking difficult and had to go back to using a cane. He was given a L troch bursa injection on 10/15/21 along with a Medrol steroid dose pack as of 12/09/21 with some relief of pain but pain returned once dose pack completed. Pain has been better controlled since 2nd steroid dose pack but pt concerned it will come back again. Deficits include L lateral hip pain, decreased L hip ROM, significantly decreased L>R proximal LE flexibility, increased muscle tension t/o L hip musculature with TTP over L greater trochanter, and mild L>R proximal LE weakness. [place] "12/11/21" will benefit from skilled PT to address above deficits in order to reduce irritation over R greater trochanter and allow return to PLOF w/o pain interference.  OBJECTIVE IMPAIRMENTS: decreased activity tolerance, decreased knowledge of condition, difficulty walking, decreased ROM, decreased strength, hypomobility, increased fascial restrictions, impaired perceived functional ability, increased muscle spasms, impaired flexibility, and pain.   ACTIVITY LIMITATIONS: sleeping, stairs, transfers, and locomotion level  PARTICIPATION LIMITATIONS: interpersonal relationship, community activity, and yard work  PERSONAL FACTORS: Age, Past/current experiences, Time since onset of injury/illness/exacerbation, and 3+ comorbidities: Conversion of previous hip surgery to left total hip arthroplasty via posterior approach 10/07/20, TIA 01/08/21, HTN, HLD, DM, CAD, afib s/p ablation 08/18/21, pacemaker 02/22/21, OA  are also affecting patient's functional outcome.   REHAB POTENTIAL: Good  CLINICAL DECISION MAKING: Stable/uncomplicated  EVALUATION COMPLEXITY: Low   GOALS: Goals reviewed with patient? Yes  LONG TERM GOALS: Target date: 02/04/2022   Patient will be independent with advanced/ongoing HEP to improve outcomes and carryover.   Baseline:  Goal status: INITIAL  2.  Patient will report at least 75% improvement in L hip pain to improve QOL. Baseline:  Goal status: INITIAL  3.  Patient will demonstrate improved L proximal LE strength to >/= 4+/5 for improved stability and ease of mobility . Baseline:  Goal status: INITIAL  4.  Patient to report ability to perform ADLs, household, and leisure activities without limitation due to L hip pain, LOM or weakness.  Baseline:  Goal status: INITIAL  5.  Patient will report at least 9 point improvement on LEFS to demonstrate improved functional ability. Baseline:  Goal status: INITIAL   PLAN: PT FREQUENCY: 2x/week  PT DURATION: 3 weeks  PLANNED INTERVENTIONS: Therapeutic exercises, Therapeutic activity, Neuromuscular re-education, Balance training, Gait training, Patient/Family education, Self Care, Joint mobilization, Stair training, Dry Needling, Electrical stimulation, Cryotherapy, Moist heat, Taping, Ultrasound, Ionotophoresis 4mg /ml Dexamethasone, Manual therapy, and Re-evaluation  PLAN FOR NEXT SESSION: Hip FOTO vs LEFS; review initial HEP; progress L hip/proximal LE flexibility; initiate proximal LE strengthening & update HEP accordingly; MT +/- DN to address increased muscle tension/tightness in L hip musculature; modalities including ionto patch as indicated for pain   , PT 01/11/2022, 7:58 PM

## 2022-01-12 ENCOUNTER — Ambulatory Visit: Payer: Medicare Other

## 2022-01-18 ENCOUNTER — Encounter: Payer: Self-pay | Admitting: Physical Therapy

## 2022-01-18 ENCOUNTER — Ambulatory Visit: Payer: Medicare Other | Admitting: Physical Therapy

## 2022-01-18 DIAGNOSIS — M25652 Stiffness of left hip, not elsewhere classified: Secondary | ICD-10-CM

## 2022-01-18 DIAGNOSIS — M25552 Pain in left hip: Secondary | ICD-10-CM

## 2022-01-18 DIAGNOSIS — R262 Difficulty in walking, not elsewhere classified: Secondary | ICD-10-CM

## 2022-01-18 DIAGNOSIS — R29898 Other symptoms and signs involving the musculoskeletal system: Secondary | ICD-10-CM

## 2022-01-18 DIAGNOSIS — M6281 Muscle weakness (generalized): Secondary | ICD-10-CM

## 2022-01-18 NOTE — Therapy (Signed)
OUTPATIENT PHYSICAL THERAPY LOWER EXTREMITY TREATMENT   Patient Name: Mike [name] MRN: [id number] DOB:1950-09-19, 71 y.o., male Today's Date: 01/18/2022   PT End of Session - 01/18/22 0838     Visit Number 2    Number of Visits 7    Date for PT Re-Evaluation 02/04/22    Authorization Type [employer]    PT Start Time [id number]    PT Stop Time 0929    PT Time Calculation (min) 51 min    Activity Tolerance Patient tolerated treatment well    Behavior During Therapy [facility] for tasks assessed/performed              Past Medical History:  Diagnosis Date   Arthritis    hip, hands thumbs   Coronary artery disease    Diabetes mellitus without complication (HCC)    Dysrhythmia 2012   A-Fib   History of kidney stones    Hyperlipidemia    Hypertension    Past Surgical History:  Procedure Laterality Date   TOTAL HIP ARTHROPLASTY Left 10/07/2020   Procedure: Conversion of previous hip surgery to left total hip arthroplasty-posterior approach;  Surgeon: Ollen Gross, MD;  Location: [facility];  Service: Orthopedics;  Laterality: Left;    Patient Active Problem List   Diagnosis Date Noted   OA (osteoarthritis) of hip 10/07/2020   S/P total left hip arthroplasty 10/07/2020    PCP: Ignacia Palma., MD  REFERRING PROVIDER: Malvin Johns  REFERRING DIAG: [phone] (ICD-10-CM) - Presence of unspecified artificial hip joint  THERAPY DIAG:  Pain in left hip  Stiffness of left hip, not elsewhere classified  Muscle weakness (generalized)  Other symptoms and signs involving the musculoskeletal system  Difficulty in walking, not elsewhere classified  RATIONALE FOR EVALUATION AND TREATMENT: Rehabilitation  ONSET DATE:  ~May 2022  NEXT MD VISIT:  none scheduled   SUBJECTIVE:   SUBJECTIVE STATEMENT: Pt reports increased soreness after the initial assessment lasting 2 days, but denies pain today. He denies any issues with the initial HEP.  PAIN:  Are you having  pain? No  PERTINENT HISTORY: 10/07/20 - Conversion of previous hip surgery to left total hip arthroplasty-posterior approach; PMH significant for TIA 01/08/21, HTN, HLD, DM, CAD, afib s/p ablation 08/18/21, pacemaker 02/22/21, OA  PRECAUTIONS: ICD/Pacemaker    WEIGHT BEARING RESTRICTIONS: No  FALLS:  Has patient fallen in last 6 months? No  LIVING ENVIRONMENT: Lives with: lives with their spouse Lives in: House/apartment Stairs: Yes: External:  2 steps; post Has following equipment at home: Single point cane and Crutches  OCCUPATION: Retired   PLOF: Independent and Leisure: walking, working in the garden  PATIENT GOALS: "To keep the pain from coming back."   OBJECTIVE:   DIAGNOSTIC FINDINGS:  N/A  PATIENT SURVEYS:  LEFS 55/80 = 68.8%    COGNITION: Overall cognitive status: Within functional limits for tasks assessed     SENSATION: WFL  EDEMA:  N/A  MUSCLE LENGTH: Hamstrings: mod tight B ITB: mod tight B Piriformis: mod tight R>L Hip flexors: mod/severe tight L>R Quads: mod tight B  POSTURE:  No Significant postural limitations  PALPATION: TTP over L greater trochanter  LOWER EXTREMITY ROM:  Active ROM Right Eval 01/11/22 Left Eval 01/11/22  Hip flexion 105 94  Hip extension  limited  Hip abduction    Hip adduction    Hip internal rotation    Hip external rotation    Knee flexion    Knee extension    Ankle  dorsiflexion    Ankle plantarflexion    Ankle inversion    Ankle eversion     (Blank rows = not tested)  LOWER EXTREMITY MMT:  MMT Right Eval 01/11/22 Left Eval 01/11/22  Hip flexion 4 4  Hip extension 4 4- Limited motion  Hip abduction 4 4-  Hip adduction 4+ 4  Hip internal rotation 5 4+  Hip external rotation 4 4-  Knee flexion 4+ 4+  Knee extension 5 5  Ankle dorsiflexion 5 5  Ankle plantarflexion    Ankle inversion    Ankle eversion     (Blank rows = not tested)  LOWER EXTREMITY SPECIAL TESTS:  Hip special tests: Ober's test:  positive   GAIT: Assistive device utilized: None Level of assistance: Complete Independence Gait pattern: WFL   TODAY'S TREATMENT:  01/18/22 THERAPEUTIC EXERCISE: to improve flexibility, strength and mobility.  Verbal and tactile cues throughout for technique. NuStep - L4 x 6 min L/R supine SKTC stretch 2 x 30" - cues to keep opp LE straight L/R hooklying HS stretch with strap 2 x 30" - cues to keep target LE straight L/R supine crossbody ITB stretch with strap 2 x 30" - cues to avoid trunk rotation L mod thomas hip stretch with strap 2 x 30" - cues to keep knee below hip  L/R hooklying KTOS piriformis stretch 2 x 30" Bridge + GTB hip abduction isometric 10 x 5" L sidelying GTB clam 10 x 3-5"  MANUAL THERAPY: To promote normalized muscle tension, improved flexibility, increased ROM, and reduced pain.  STM/DTM to L upper lateral glutes and TFL   MODALITIES: Ionotophoresis with 1.0 mL 4mg /ml Dexamethasone - 4-6 hour patch (31mA-min) to L greater trochanter (patch #2 of 6)   01/11/22 THERAPEUTIC EXERCISE: Instruction in initial HEP (see below) to improve flexibility, strength and mobility.  Verbal and tactile cues throughout for technique.  MODALITIES: Ionotophoresis with 1.0 mL 4mg /ml Dexamethasone - 4-6 hour patch (16mA-min) to L greater trochanter (patch #1 of 6)   PATIENT EDUCATION:  Education details: HEP progression - hip strengthening and self-STM techniques to L glutes and TFL using tennis ball on wall Person educated: Patient Education method: Explanation, Demonstration, Verbal cues, and Handouts Education comprehension: verbalized understanding, returned demonstration, verbal cues required, and needs further education   HOME EXERCISE PROGRAM: Access Code: URL: [email or web address] Date: 01/11/2022 Prepared by: [email or web address]  Exercises - Supine Single Knee to Chest Stretch  - 2-3 x daily - 7 x weekly - 3 reps - 30 sec hold - Hooklying  Hamstring Stretch with Strap  - 2-3 x daily - 7 x weekly - 3 reps - 30 sec hold - Supine ITB Stretch with Strap  - 2-3 x daily - 7 x weekly - 3 reps - 30 sec hold - Supine Quadriceps Stretch with Strap on Table  - 2-3 x daily - 7 x weekly - 3 reps - 30 sec hold - Supine Piriformis Stretch with Foot on Ground  - 2-3 x daily - 7 x weekly - 3 reps - 30 sec hold - Bridges with GTB - GTB clam  Patient Education - Ionto Pt Instructions - [facility]  ASSESSMENT:  CLINICAL IMPRESSION: Lecil Tapp" reported some increased soreness following the initial visit which is now resolved. Initial HEP reviewed with minor clarifications for positioning provided but otherwise pt able to perform good return demonstration. Initiated proximal LE strengthening targeting areas of greatest weakness identified on eval. Increased muscle tension/tightness with TTP identified in L  upper/lateral glutes and TFL which were addressed with manual STM/DTM and TPR with guidance provided on how to perform self-release with ball on wall at home - he may benefit from DN to address the abnormal muscle tension pending response to stretching and MT. Session concluded with 2nd ionto patch application.  OBJECTIVE IMPAIRMENTS: decreased activity tolerance, decreased knowledge of condition, difficulty walking, decreased ROM, decreased strength, hypomobility, increased fascial restrictions, impaired perceived functional ability, increased muscle spasms, impaired flexibility, and pain.   ACTIVITY LIMITATIONS: sleeping, stairs, transfers, and locomotion level  PARTICIPATION LIMITATIONS: interpersonal relationship, community activity, and yard work  PERSONAL FACTORS: Age, Past/current experiences, Time since onset of injury/illness/exacerbation, and 3+ comorbidities: Conversion of previous hip surgery to left total hip arthroplasty via posterior approach 10/07/20, TIA 01/08/21, HTN, HLD, DM, CAD, afib s/p ablation 08/18/21, pacemaker 02/22/21, OA  are  also affecting patient's functional outcome.   REHAB POTENTIAL: Good  CLINICAL DECISION MAKING: Stable/uncomplicated  EVALUATION COMPLEXITY: Low   GOALS: Goals reviewed with patient? Yes  LONG TERM GOALS: Target date: 02/04/2022   Patient will be independent with advanced/ongoing HEP to improve outcomes and carryover.  Baseline:  Goal status: IN PROGRESS  2.  Patient will report at least 75% improvement in L hip pain to improve QOL. Baseline:  Goal status: IN PROGRESS  3.  Patient will demonstrate improved L proximal LE strength to >/= 4+/5 for improved stability and ease of mobility . Baseline:  Goal status: IN PROGRESS  4.  Patient to report ability to perform ADLs, household, and leisure activities without limitation due to L hip pain, LOM or weakness.  Baseline:  Goal status: IN PROGRESS  5.  Patient will report at least 9 point improvement on LEFS to demonstrate improved functional ability. Baseline: 55/80 = 68.8% Goal status: IN PROGRESS   PLAN: PT FREQUENCY: 2x/week  PT DURATION: 3 weeks  PLANNED INTERVENTIONS: Therapeutic exercises, Therapeutic activity, Neuromuscular re-education, Balance training, Gait training, Patient/Family education, Self Care, Joint mobilization, Stair training, Dry Needling, Electrical stimulation, Cryotherapy, Moist heat, Taping, Ultrasound, Ionotophoresis 4mg /ml Dexamethasone, Manual therapy, and Re-evaluation  PLAN FOR NEXT SESSION: progress L hip/proximal LE flexibility and proximal LE strengthening & update HEP accordingly; MT +/- DN to address increased muscle tension/tightness in L hip musculature; modalities including ionto patch as indicated for pain   , PT 01/18/2022, 12:30 PM

## 2022-01-21 ENCOUNTER — Encounter: Payer: Self-pay | Admitting: Physical Therapy

## 2022-01-21 ENCOUNTER — Ambulatory Visit: Payer: Medicare Other | Admitting: Physical Therapy

## 2022-01-21 DIAGNOSIS — M6281 Muscle weakness (generalized): Secondary | ICD-10-CM

## 2022-01-21 DIAGNOSIS — M25652 Stiffness of left hip, not elsewhere classified: Secondary | ICD-10-CM

## 2022-01-21 DIAGNOSIS — M25552 Pain in left hip: Secondary | ICD-10-CM | POA: Diagnosis not present

## 2022-01-21 DIAGNOSIS — R262 Difficulty in walking, not elsewhere classified: Secondary | ICD-10-CM

## 2022-01-21 DIAGNOSIS — R29898 Other symptoms and signs involving the musculoskeletal system: Secondary | ICD-10-CM

## 2022-01-21 NOTE — Therapy (Signed)
OUTPATIENT PHYSICAL THERAPY LOWER EXTREMITY TREATMENT   Patient Name: Mike [name] MRN: [id number] DOB:Dec 15, 1950, 71 y.o., male Today's Date: 01/21/2022   PT End of Session - 01/21/22 0801     Visit Number 3    Number of Visits 7    Date for PT Re-Evaluation 02/04/22    Authorization Type [employer]    PT Start Time 0801    PT Stop Time 0847    PT Time Calculation (min) 46 min    Activity Tolerance Patient tolerated treatment well    Behavior During Therapy [facility] for tasks assessed/performed               Past Medical History:  Diagnosis Date   Arthritis    hip, hands thumbs   Coronary artery disease    Diabetes mellitus without complication (HCC)    Dysrhythmia 2012   A-Fib   History of kidney stones    Hyperlipidemia    Hypertension    Past Surgical History:  Procedure Laterality Date   TOTAL HIP ARTHROPLASTY Left 10/07/2020   Procedure: Conversion of previous hip surgery to left total hip arthroplasty-posterior approach;  Surgeon: Ollen Gross, MD;  Location: [facility];  Service: Orthopedics;  Laterality: Left;    Patient Active Problem List   Diagnosis Date Noted   OA (osteoarthritis) of hip 10/07/2020   S/P total left hip arthroplasty 10/07/2020    PCP: Ignacia Palma., MD  REFERRING PROVIDER: Malvin Johns  REFERRING DIAG: [phone] (ICD-10-CM) - Presence of unspecified artificial hip joint  THERAPY DIAG:  Pain in left hip  Stiffness of left hip, not elsewhere classified  Muscle weakness (generalized)  Other symptoms and signs involving the musculoskeletal system  Difficulty in walking, not elsewhere classified  RATIONALE FOR EVALUATION AND TREATMENT: Rehabilitation  ONSET DATE:  ~May 2022  NEXT MD VISIT:  none scheduled   SUBJECTIVE:   SUBJECTIVE STATEMENT: Pt reports he slept on his L side last night and this has aggravated his hip.  PAIN:  Are you having pain? Yes: NPRS scale: 2/10 Pain location: L lateral  hip Pain description: ache Aggravating factors: sleeping on L side  PERTINENT HISTORY: 10/07/20 - Conversion of previous hip surgery to left total hip arthroplasty-posterior approach; PMH significant for TIA 01/08/21, HTN, HLD, DM, CAD, afib s/p ablation 08/18/21, pacemaker 02/22/21, OA  PRECAUTIONS: ICD/Pacemaker    WEIGHT BEARING RESTRICTIONS: No  FALLS:  Has patient fallen in last 6 months? No  LIVING ENVIRONMENT: Lives with: lives with their spouse Lives in: House/apartment Stairs: Yes: External:  2 steps; post Has following equipment at home: Single point cane and Crutches  OCCUPATION: Retired   PLOF: Independent and Leisure: walking, working in the garden  PATIENT GOALS: "To keep the pain from coming back."   OBJECTIVE:   DIAGNOSTIC FINDINGS:  N/A  PATIENT SURVEYS:  LEFS 55/80 = 68.8%    COGNITION: Overall cognitive status: Within functional limits for tasks assessed     SENSATION: WFL  EDEMA:  N/A  MUSCLE LENGTH: Hamstrings: mod tight B ITB: mod tight B Piriformis: mod tight R>L Hip flexors: mod/severe tight L>R Quads: mod tight B  POSTURE:  No Significant postural limitations  PALPATION: TTP over L greater trochanter  LOWER EXTREMITY ROM:  Active ROM Right Eval 01/11/22 Left Eval 01/11/22  Hip flexion 105 94  Hip extension  limited  Hip abduction    Hip adduction    Hip internal rotation    Hip external rotation  Knee flexion    Knee extension    Ankle dorsiflexion    Ankle plantarflexion    Ankle inversion    Ankle eversion     (Blank rows = not tested)  LOWER EXTREMITY MMT:  MMT Right Eval 01/11/22 Left Eval 01/11/22  Hip flexion 4 4  Hip extension 4 4- Limited motion  Hip abduction 4 4-  Hip adduction 4+ 4  Hip internal rotation 5 4+  Hip external rotation 4 4-  Knee flexion 4+ 4+  Knee extension 5 5  Ankle dorsiflexion 5 5  Ankle plantarflexion    Ankle inversion    Ankle eversion     (Blank rows = not tested)  LOWER  EXTREMITY SPECIAL TESTS:  Hip special tests: Ober's test: positive   GAIT: Assistive device utilized: None Level of assistance: Complete Independence Gait pattern: WFL   TODAY'S TREATMENT:  01/21/22 THERAPEUTIC EXERCISE: to improve flexibility, strength and mobility.  Verbal and tactile cues throughout for technique. NuStep - L5 x 6 min L/R supine crossbody ITB stretch with strap 2 x 30" - cues to avoid trunk rotation L hooklying KTOS piriformis stretch 2 x 30" L hooklying figure-4 piriformis stretch with over-pressure 2 x 30" L seated KTOS piriformis stretch 2 x 30" L seated figure-4 piriformis stretch with over-pressure vs hip hinge x 30" each Hooklying GTB alt hip ABD/ER clam 10 x 5" Bridge + GTB hip abduction isometric 10 x 5" L sidelying GTB clam 10 x 3-5" Standing lateral lean ITB stretch at wall 3 x 30" Standing RTB 4-way SLR 10 x 3"  MODALITIES: Ionotophoresis with 1.0 mL 4mg /ml Dexamethasone - 4-6 hour patch (49mA-min) to L greater trochanter (patch #3 of 6)   01/18/22 THERAPEUTIC EXERCISE: to improve flexibility, strength and mobility.  Verbal and tactile cues throughout for technique. NuStep - L4 x 6 min L/R supine SKTC stretch 2 x 30" - cues to keep opp LE straight L/R hooklying HS stretch with strap 2 x 30" - cues to keep target LE straight L/R supine crossbody ITB stretch with strap 2 x 30" - cues to avoid trunk rotation L mod thomas hip stretch with strap 2 x 30" - cues to keep knee below hip  L/R hooklying KTOS piriformis stretch 2 x 30" Bridge + GTB hip abduction isometric 10 x 5" L sidelying GTB clam 10 x 3-5"  MANUAL THERAPY: To promote normalized muscle tension, improved flexibility, increased ROM, and reduced pain.  STM/DTM to L upper lateral glutes and TFL   MODALITIES: Ionotophoresis with 1.0 mL 4mg /ml Dexamethasone - 4-6 hour patch (86mA-min) to L greater trochanter (patch #2 of 6)   01/11/22 THERAPEUTIC EXERCISE: Instruction in initial HEP (see  below) to improve flexibility, strength and mobility.  Verbal and tactile cues throughout for technique.  MODALITIES: Ionotophoresis with 1.0 mL 4mg /ml Dexamethasone - 4-6 hour patch (13mA-min) to L greater trochanter (patch #1 of 6)   PATIENT EDUCATION:  Education details: HEP progression - hip strengthening and self-STM techniques to L glutes and TFL using tennis ball on wall Person educated: Patient Education method: Explanation, Demonstration, Verbal cues, and Handouts Education comprehension: verbalized understanding, returned demonstration, verbal cues required, and needs further education   HOME EXERCISE PROGRAM: Access Code: [email or web address] URL: [email or web address] Date: 01/21/2022 Prepared by: Annie Paras  Exercises - Supine Single Knee to Chest Stretch  - 2-3 x daily - 7 x weekly - 3 reps - 30 sec hold - Hooklying Hamstring Stretch with Strap  - 2-3 x daily -  7 x weekly - 3 reps - 30 sec hold - Supine ITB Stretch with Strap  - 2-3 x daily - 7 x weekly - 3 reps - 30 sec hold - Supine Quadriceps Stretch with Strap on Table  - 2-3 x daily - 7 x weekly - 3 reps - 30 sec hold - Supine Piriformis Stretch with Foot on Ground  - 2-3 x daily - 7 x weekly - 3 reps - 30 sec hold - Bridge with Resistance  - 1 x daily - 7 x weekly - 2 sets - 10 reps - 5 sec hold - Clam with Resistance  - 1 x daily - 7 x weekly - 2 sets - 10 reps - 3-5 sec hold - Seated Figure 4 Piriformis Stretch  - 2-3 x daily - 7 x weekly - 3 reps - 30 sec hold - Seated Piriformis Stretch  - 2-3 x daily - 7 x weekly - 3 reps - 30 sec hold - Standing ITB Stretch  - 2-3 x daily - 7 x weekly - 3 reps - 30 sec hold - Standing Hip Flexion with Anchored Resistance and Chair Support  - 1 x daily - 3 x weekly - 2 sets - 10 reps - 3 sec hold - Standing Hip Adduction with Anchored Resistance  - 1 x daily - 3 x weekly - 2 sets - 10 reps - 3 sec hold - Standing Hip Extension with Anchored Resistance  - 1 x daily - 3 x  weekly - 2 sets - 10 reps - 3 sec hold - Standing Hip Abduction with Anchored Resistance  - 1 x daily - 3 x weekly - 2 sets - 10 reps - 3 sec hold  Patient Education - Ionto Pt Instructions - [facility]  ASSESSMENT:  CLINICAL IMPRESSION: Tyri Schmidt" reports less soreness following last PT session but is still uncertain about some of the stretches. Reviewed the ITB and piriformis stretches offering seated alternatives for the piriformis stretches and standing alternative for ITB stretch upon pt request. Reviewed hip strengthening initiated last visit and progressed strengthening to include standing 4-way hip with good tolerance. Session concluded with 3rd ionto patch application.  OBJECTIVE IMPAIRMENTS: decreased activity tolerance, decreased knowledge of condition, difficulty walking, decreased ROM, decreased strength, hypomobility, increased fascial restrictions, impaired perceived functional ability, increased muscle spasms, impaired flexibility, and pain.   ACTIVITY LIMITATIONS: sleeping, stairs, transfers, and locomotion level  PARTICIPATION LIMITATIONS: interpersonal relationship, community activity, and yard work  PERSONAL FACTORS: Age, Past/current experiences, Time since onset of injury/illness/exacerbation, and 3+ comorbidities: Conversion of previous hip surgery to left total hip arthroplasty via posterior approach 10/07/20, TIA 01/08/21, HTN, HLD, DM, CAD, afib s/p ablation 08/18/21, pacemaker 02/22/21, OA  are also affecting patient's functional outcome.   REHAB POTENTIAL: Good  CLINICAL DECISION MAKING: Stable/uncomplicated  EVALUATION COMPLEXITY: Low   GOALS: Goals reviewed with patient? Yes  LONG TERM GOALS: Target date: 02/04/2022   Patient will be independent with advanced/ongoing HEP to improve outcomes and carryover.  Baseline:  Goal status: IN PROGRESS  2.  Patient will report at least 75% improvement in L hip pain to improve QOL. Baseline:  Goal status: IN  PROGRESS  3.  Patient will demonstrate improved L proximal LE strength to >/= 4+/5 for improved stability and ease of mobility . Baseline:  Goal status: IN PROGRESS  4.  Patient to report ability to perform ADLs, household, and leisure activities without limitation due to L hip pain, LOM or weakness.  Baseline:  Goal status: IN PROGRESS  5.  Patient will report at least 9 point improvement on LEFS to demonstrate improved functional ability. Baseline: 55/80 = 68.8% Goal status: IN PROGRESS   PLAN: PT FREQUENCY: 2x/week  PT DURATION: 3 weeks  PLANNED INTERVENTIONS: Therapeutic exercises, Therapeutic activity, Neuromuscular re-education, Balance training, Gait training, Patient/Family education, Self Care, Joint mobilization, Stair training, Dry Needling, Electrical stimulation, Cryotherapy, Moist heat, Taping, Ultrasound, Ionotophoresis 4mg /ml Dexamethasone, Manual therapy, and Re-evaluation  PLAN FOR NEXT SESSION: progress L hip/proximal LE flexibility and proximal LE strengthening & update HEP accordingly; MT +/- DN to address increased muscle tension/tightness in L hip musculature; modalities including ionto patch as indicated for pain   , PT 01/21/2022, 12:54 PM

## 2022-01-25 ENCOUNTER — Encounter: Payer: Self-pay | Admitting: Physical Therapy

## 2022-01-25 ENCOUNTER — Ambulatory Visit: Payer: Medicare Other | Admitting: Physical Therapy

## 2022-01-25 DIAGNOSIS — M25552 Pain in left hip: Secondary | ICD-10-CM | POA: Diagnosis not present

## 2022-01-25 DIAGNOSIS — M6281 Muscle weakness (generalized): Secondary | ICD-10-CM

## 2022-01-25 DIAGNOSIS — R29898 Other symptoms and signs involving the musculoskeletal system: Secondary | ICD-10-CM

## 2022-01-25 DIAGNOSIS — R262 Difficulty in walking, not elsewhere classified: Secondary | ICD-10-CM

## 2022-01-25 DIAGNOSIS — M25652 Stiffness of left hip, not elsewhere classified: Secondary | ICD-10-CM

## 2022-01-25 NOTE — Therapy (Addendum)
OUTPATIENT PHYSICAL THERAPY LOWER EXTREMITY TREATMENT   Patient Name: Mike [name] MRN: [id number] DOB:Jun 08, 1951, 71 y.o., male Today's Date: 01/25/2022   PT End of Session - 01/25/22 0804     Visit Number 4    Number of Visits 7    Date for PT Re-Evaluation 02/04/22    Authorization Type [employer]    PT Start Time 0804    PT Stop Time 0854    PT Time Calculation (min) 50 min    Activity Tolerance Patient tolerated treatment well    Behavior During Therapy [facility] for tasks assessed/performed                Past Medical History:  Diagnosis Date   Arthritis    hip, hands thumbs   Coronary artery disease    Diabetes mellitus without complication (HCC)    Dysrhythmia 2012   A-Fib   History of kidney stones    Hyperlipidemia    Hypertension    Past Surgical History:  Procedure Laterality Date   TOTAL HIP ARTHROPLASTY Left 10/07/2020   Procedure: Conversion of previous hip surgery to left total hip arthroplasty-posterior approach;  Surgeon: Ollen Gross, MD;  Location: [facility];  Service: Orthopedics;  Laterality: Left;    Patient Active Problem List   Diagnosis Date Noted   OA (osteoarthritis) of hip 10/07/2020   S/P total left hip arthroplasty 10/07/2020    PCP: Ignacia Palma., MD  REFERRING PROVIDER: Malvin Johns  REFERRING DIAG: [phone] (ICD-10-CM) - Presence of unspecified artificial hip joint  THERAPY DIAG:  Pain in left hip  Stiffness of left hip, not elsewhere classified  Muscle weakness (generalized)  Other symptoms and signs involving the musculoskeletal system  Difficulty in walking, not elsewhere classified  RATIONALE FOR EVALUATION AND TREATMENT: Rehabilitation  ONSET DATE:  ~May 2022  NEXT MD VISIT:  none scheduled   SUBJECTIVE:   SUBJECTIVE STATEMENT: Pt reports some new pain today in his L lateral knee. Also noting chest pain (intermittent mild ache) which associates with his afib - he feels that this may be  related to his pacemaker attempt to correct his rhythm (he has been worked up for the chest pain in the past).  PAIN:  Are you having pain? Yes: NPRS scale: 2-3/10 Pain location: L lateral knee Pain description: ache Aggravating factors: sleeping on L side  PERTINENT HISTORY: 10/07/20 - Conversion of previous hip surgery to left total hip arthroplasty-posterior approach; PMH significant for TIA 01/08/21, HTN, HLD, DM, CAD, afib s/p ablation 08/18/21, pacemaker 02/22/21, OA  PRECAUTIONS: ICD/Pacemaker    WEIGHT BEARING RESTRICTIONS: No  FALLS:  Has patient fallen in last 6 months? No  LIVING ENVIRONMENT: Lives with: lives with their spouse Lives in: House/apartment Stairs: Yes: External:  2 steps; post Has following equipment at home: Single point cane and Crutches  OCCUPATION: Retired   PLOF: Independent and Leisure: walking, working in the garden  PATIENT GOALS: "To keep the pain from coming back."   OBJECTIVE:   DIAGNOSTIC FINDINGS:  N/A  PATIENT SURVEYS:  LEFS 55/80 = 68.8%    COGNITION: Overall cognitive status: Within functional limits for tasks assessed     SENSATION: WFL  EDEMA:  N/A  MUSCLE LENGTH: Hamstrings: mod tight B ITB: mod tight B Piriformis: mod tight R>L Hip flexors: mod/severe tight L>R Quads: mod tight B  POSTURE:  No Significant postural limitations  PALPATION: TTP over L greater trochanter  LOWER EXTREMITY ROM:  Active ROM Right Eval 01/11/22  Left Eval 01/11/22  Hip flexion 105 94  Hip extension  limited  Hip abduction    Hip adduction    Hip internal rotation    Hip external rotation    Knee flexion    Knee extension    Ankle dorsiflexion    Ankle plantarflexion    Ankle inversion    Ankle eversion     (Blank rows = not tested)  LOWER EXTREMITY MMT:  MMT Right Eval 01/11/22 Left Eval 01/11/22  Hip flexion 4 4  Hip extension 4 4- Limited motion  Hip abduction 4 4-  Hip adduction 4+ 4  Hip internal rotation 5 4+   Hip external rotation 4 4-  Knee flexion 4+ 4+  Knee extension 5 5  Ankle dorsiflexion 5 5  Ankle plantarflexion    Ankle inversion    Ankle eversion     (Blank rows = not tested)  LOWER EXTREMITY SPECIAL TESTS:  Hip special tests: Ober's test: positive   GAIT: Assistive device utilized: None Level of assistance: Complete Independence Gait pattern: WFL   TODAY'S TREATMENT:  01/25/22 VS: BP = 118/74, HR = 78, O2 sats = 95%  THERAPEUTIC EXERCISE: to improve flexibility, strength and mobility.  Verbal and tactile cues throughout for technique. NuStep - L5 x 6 min Standing lateral lean ITB stretch at wall 3 x 30"  MANUAL THERAPY: To promote normalized muscle tension, improved flexibility, increased ROM, and reduced pain. Skilled palpation and monitoring of soft tissue during DN Trigger Point Dry-Needling  Treatment instructions: Expect mild to moderate muscle soreness. Patient verbalized understanding of these instructions and education. Patient Consent Given: Yes Education handout provided: Yes Muscles treated: L VL (distal, mid & proximal) and L TFL Electrical stimulation performed: No Parameters: N/A Treatment response/outcome: Twitch Response Elicited and Palpable Increase in Muscle Length SMT/DTM, manual TPR and pin & stretch to muscles addressed with DN  MODALITIES: Ionotophoresis with 1.0 mL 4mg /ml Dexamethasone - 4-6 hour patch (76mA-min) to L greater trochanter (patch #4 of 6)   01/21/22 THERAPEUTIC EXERCISE: to improve flexibility, strength and mobility.  Verbal and tactile cues throughout for technique. NuStep - L5 x 6 min L/R supine crossbody ITB stretch with strap 2 x 30" - cues to avoid trunk rotation L hooklying KTOS piriformis stretch 2 x 30" L hooklying figure-4 piriformis stretch with over-pressure 2 x 30" L seated KTOS piriformis stretch 2 x 30" L seated figure-4 piriformis stretch with over-pressure vs hip hinge x 30" each Hooklying GTB alt hip  ABD/ER clam 10 x 5" Bridge + GTB hip abduction isometric 10 x 5" L sidelying GTB clam 10 x 3-5"  Standing RTB 4-way SLR 10 x 3"  MODALITIES: Ionotophoresis with 1.0 mL 4mg /ml Dexamethasone - 4-6 hour patch (62mA-min) to L greater trochanter (patch #3 of 6)   01/18/22 THERAPEUTIC EXERCISE: to improve flexibility, strength and mobility.  Verbal and tactile cues throughout for technique. NuStep - L4 x 6 min L/R supine SKTC stretch 2 x 30" - cues to keep opp LE straight L/R hooklying HS stretch with strap 2 x 30" - cues to keep target LE straight L/R supine crossbody ITB stretch with strap 2 x 30" - cues to avoid trunk rotation L mod thomas hip stretch with strap 2 x 30" - cues to keep knee below hip  L/R hooklying KTOS piriformis stretch 2 x 30" Bridge + GTB hip abduction isometric 10 x 5" L sidelying GTB clam 10 x 3-5"  MANUAL THERAPY: To promote normalized  muscle tension, improved flexibility, increased ROM, and reduced pain.  STM/DTM to L upper lateral glutes and TFL   MODALITIES: Ionotophoresis with 1.0 mL 4mg /ml Dexamethasone - 4-6 hour patch (78mA-min) to L greater trochanter (patch #2 of 6)   01/11/22 THERAPEUTIC EXERCISE: Instruction in initial HEP (see below) to improve flexibility, strength and mobility.  Verbal and tactile cues throughout for technique.  MODALITIES: Ionotophoresis with 1.0 mL 4mg /ml Dexamethasone - 4-6 hour patch (28mA-min) to L greater trochanter (patch #1 of 6)   PATIENT EDUCATION:  Education details: role of DN and DN rational, procedure, outcomes, potential side effects, and recommended post-treatment exercises/activity Person educated: Patient Education method: Explanation and Handouts Education comprehension: verbalized understanding   HOME EXERCISE PROGRAM: AAccess [email or web address] URL: [email or web address] Date: 01/25/2022 Prepared by: Annie Paras  Exercises - Supine Single Knee to Chest Stretch  - 2-3 x daily - 7 x weekly -  3 reps - 30 sec hold - Hooklying Hamstring Stretch with Strap  - 2-3 x daily - 7 x weekly - 3 reps - 30 sec hold - Supine ITB Stretch with Strap  - 2-3 x daily - 7 x weekly - 3 reps - 30 sec hold - Supine Quadriceps Stretch with Strap on Table  - 2-3 x daily - 7 x weekly - 3 reps - 30 sec hold - Supine Piriformis Stretch with Foot on Ground  - 2-3 x daily - 7 x weekly - 3 reps - 30 sec hold - Bridge with Resistance  - 1 x daily - 7 x weekly - 2 sets - 10 reps - 5 sec hold - Clam with Resistance  - 1 x daily - 7 x weekly - 2 sets - 10 reps - 3-5 sec hold - Seated Figure 4 Piriformis Stretch  - 2-3 x daily - 7 x weekly - 3 reps - 30 sec hold - Seated Piriformis Stretch  - 2-3 x daily - 7 x weekly - 3 reps - 30 sec hold - Standing ITB Stretch  - 2-3 x daily - 7 x weekly - 3 reps - 30 sec hold - Standing Hip Flexion with Anchored Resistance and Chair Support  - 1 x daily - 3 x weekly - 2 sets - 10 reps - 3 sec hold - Standing Hip Adduction with Anchored Resistance  - 1 x daily - 3 x weekly - 2 sets - 10 reps - 3 sec hold - Standing Hip Extension with Anchored Resistance  - 1 x daily - 3 x weekly - 2 sets - 10 reps - 3 sec hold - Standing Hip Abduction with Anchored Resistance  - 1 x daily - 3 x weekly - 2 sets - 10 reps - 3 sec hold  Patient Education - [place] Pt Instructions - [facility] - Trigger Point Dry Needling   ASSESSMENT:  CLINICAL IMPRESSION: Mike [name]" reports selective performance of HEP, focusing mostly on the supine and seated stretches. Reviewed standing and supine ITB stretch due to reports of new lateral knee pain today. Increased muscle tension, banding and TPs identified in L lateral quads and TFL which is likely contributing to increased tension along ITB which appeared amenable to DN. After explanation of DN rational, procedures, outcomes and potential side effects, patient verbalized consent to DN treatment in conjunction with manual STM/DTM and TPR to reduce ttp/muscle  tension. Muscles treated as indicated above. DN produced normal response with good twitches elicited resulting in palpable reduction in pain/ttp and muscle tension with pt noting  resolution of L lateral knee pain. Pt educated to expect mild to moderate muscle soreness for up to 24-48 hrs and instructed to continue prescribed home exercise program (explaining relevance of both stretching and strengthening exercises in promoting further normalization of muscle tension) and current activity level with pt verbalizing understanding of theses instructions. Session concluded with 4th ionto patch application.  OBJECTIVE IMPAIRMENTS: decreased activity tolerance, decreased knowledge of condition, difficulty walking, decreased ROM, decreased strength, hypomobility, increased fascial restrictions, impaired perceived functional ability, increased muscle spasms, impaired flexibility, and pain.   ACTIVITY LIMITATIONS: sleeping, stairs, transfers, and locomotion level  PARTICIPATION LIMITATIONS: interpersonal relationship, community activity, and yard work  PERSONAL FACTORS: Age, Past/current experiences, Time since onset of injury/illness/exacerbation, and 3+ comorbidities: Conversion of previous hip surgery to left total hip arthroplasty via posterior approach 10/07/20, TIA 01/08/21, HTN, HLD, DM, CAD, afib s/p ablation 08/18/21, pacemaker 02/22/21, OA  are also affecting patient's functional outcome.   REHAB POTENTIAL: Good  CLINICAL DECISION MAKING: Stable/uncomplicated  EVALUATION COMPLEXITY: Low   GOALS: Goals reviewed with patient? Yes  LONG TERM GOALS: Target date: 02/04/2022   Patient will be independent with advanced/ongoing HEP to improve outcomes and carryover.  Baseline:  Goal status: IN PROGRESS  2.  Patient will report at least 75% improvement in L hip pain to improve QOL. Baseline:  Goal status: IN PROGRESS  3.  Patient will demonstrate improved L proximal LE strength to >/= 4+/5 for  improved stability and ease of mobility . Baseline:  Goal status: IN PROGRESS  4.  Patient to report ability to perform ADLs, household, and leisure activities without limitation due to L hip pain, LOM or weakness.  Baseline:  Goal status: IN PROGRESS  5.  Patient will report at least 9 point improvement on LEFS to demonstrate improved functional ability. Baseline: 55/80 = 68.8% Goal status: IN PROGRESS   PLAN: PT FREQUENCY: 2x/week  PT DURATION: 3 weeks  PLANNED INTERVENTIONS: Therapeutic exercises, Therapeutic activity, Neuromuscular re-education, Balance training, Gait training, Patient/Family education, Self Care, Joint mobilization, Stair training, Dry Needling, Electrical stimulation, Cryotherapy, Moist heat, Taping, Ultrasound, Ionotophoresis 4mg /ml Dexamethasone, Manual therapy, and Re-evaluation  PLAN FOR NEXT SESSION: assess response to DN; progress L hip/proximal LE flexibility and proximal LE strengthening & update HEP accordingly; MT +/- DN to address increased muscle tension/tightness in L hip musculature; modalities including ionto patch as indicated for pain   Percival Spanish, PT 01/25/2022, 9:11 AM

## 2022-01-28 ENCOUNTER — Ambulatory Visit: Payer: Medicare Other

## 2022-01-28 DIAGNOSIS — M25552 Pain in left hip: Secondary | ICD-10-CM

## 2022-01-28 DIAGNOSIS — M25652 Stiffness of left hip, not elsewhere classified: Secondary | ICD-10-CM

## 2022-01-28 DIAGNOSIS — R262 Difficulty in walking, not elsewhere classified: Secondary | ICD-10-CM

## 2022-01-28 DIAGNOSIS — M6281 Muscle weakness (generalized): Secondary | ICD-10-CM

## 2022-01-28 DIAGNOSIS — R29898 Other symptoms and signs involving the musculoskeletal system: Secondary | ICD-10-CM

## 2022-01-28 NOTE — Therapy (Signed)
OUTPATIENT PHYSICAL THERAPY LOWER EXTREMITY TREATMENT   Patient Name: Mike [name] MRN: [id number] DOB:23-May-1951, 71 y.o., male Today's Date: 01/28/2022   PT End of Session - 01/28/22 0842     Visit Number 5    Number of Visits 7    Date for PT Re-Evaluation 02/04/22    Authorization Type [employer]    PT Start Time 0801    PT Stop Time 0845    PT Time Calculation (min) 44 min    Activity Tolerance Patient tolerated treatment well    Behavior During Therapy [facility] for tasks assessed/performed                 Past Medical History:  Diagnosis Date   Arthritis    hip, hands thumbs   Coronary artery disease    Diabetes mellitus without complication ([place])    Dysrhythmia 2012   A-Fib   History of kidney stones    Hyperlipidemia    Hypertension    Past Surgical History:  Procedure Laterality Date   TOTAL HIP ARTHROPLASTY Left 10/07/2020   Procedure: Conversion of previous hip surgery to left total hip arthroplasty-posterior approach;  Surgeon: Gaynelle Arabian, MD;  Location: [facility];  Service: Orthopedics;  Laterality: Left;  115mn   Patient Active Problem List   Diagnosis Date Noted   OA (osteoarthritis) of hip 10/07/2020   S/P total left hip arthroplasty 10/07/2020    PCP: BVirl Son, MD  REFERRING PROVIDER: CFlorinda Marker REFERRING DIAG: [phone](ICD-10-CM) - Presence of unspecified artificial hip joint  THERAPY DIAG:  Pain in left hip  Stiffness of left hip, not elsewhere classified  Muscle weakness (generalized)  Other symptoms and signs involving the musculoskeletal system  Difficulty in walking, not elsewhere classified  RATIONALE FOR EVALUATION AND TREATMENT: Rehabilitation  ONSET DATE:  ~May 2022  NEXT MD VISIT:  none scheduled   SUBJECTIVE:   SUBJECTIVE STATEMENT: Pt reports inc knee pain today, doesn't feel like the DN helped from last time.  PAIN:  Are you having pain? Yes: NPRS scale: 5/10 Pain location: L lateral  knee Pain description: ache Aggravating factors: sleeping on L side  PERTINENT HISTORY: 10/07/20 - Conversion of previous hip surgery to left total hip arthroplasty-posterior approach; PMH significant for TIA 01/08/21, HTN, HLD, DM, CAD, afib s/p ablation 08/18/21, pacemaker 02/22/21, OA  PRECAUTIONS: ICD/Pacemaker    WEIGHT BEARING RESTRICTIONS: No  FALLS:  Has patient fallen in last 6 months? No  LIVING ENVIRONMENT: Lives with: lives with their spouse Lives in: House/apartment Stairs: Yes: External:  2 steps; post Has following equipment at home: Single point cane and Crutches  OCCUPATION: Retired   PLOF: Independent and Leisure: walking, working in the garden  PATIENT GOALS: "To keep the pain from coming back."   OBJECTIVE:   DIAGNOSTIC FINDINGS:  N/A  PATIENT SURVEYS:  LEFS 55/80 = 68.8%    COGNITION: Overall cognitive status: Within functional limits for tasks assessed     SENSATION: WFL  EDEMA:  N/A  MUSCLE LENGTH: Hamstrings: mod tight B ITB: mod tight B Piriformis: mod tight R>L Hip flexors: mod/severe tight L>R Quads: mod tight B  POSTURE:  No Significant postural limitations  PALPATION: TTP over L greater trochanter  LOWER EXTREMITY ROM:  Active ROM Right Eval 01/11/22 Left Eval 01/11/22  Hip flexion 105 94  Hip extension  limited  Hip abduction    Hip adduction    Hip internal rotation    Hip external rotation  Knee flexion    Knee extension    Ankle dorsiflexion    Ankle plantarflexion    Ankle inversion    Ankle eversion     (Blank rows = not tested)  LOWER EXTREMITY MMT:  MMT Right Eval 01/11/22 Left Eval 01/11/22  Hip flexion 4 4  Hip extension 4 4- Limited motion  Hip abduction 4 4-  Hip adduction 4+ 4  Hip internal rotation 5 4+  Hip external rotation 4 4-  Knee flexion 4+ 4+  Knee extension 5 5  Ankle dorsiflexion 5 5  Ankle plantarflexion    Ankle inversion    Ankle eversion     (Blank rows = not  tested)  LOWER EXTREMITY SPECIAL TESTS:  Hip special tests: Ober's test: positive   GAIT: Assistive device utilized: None Level of assistance: Complete Independence Gait pattern: WFL   TODAY'S TREATMENT: 01/28/22 Therapeutic Exercise: Nustep L5x69mn Thomas stretch 2x30 sec Supine ITB stretch 2x30" Supine piriformis KTOS and figure 4 stretch 2x30" sec Seated hip ABD w/ RTB  2 x 10  Manual Therapy: STM to L ITB attachment, distal VL and lateral hamstrings  01/21/22 VS: BP = 118/74, HR = 78, O2 sats = 95%  THERAPEUTIC EXERCISE: to improve flexibility, strength and mobility.  Verbal and tactile cues throughout for technique. NuStep - L5 x 6 min Standing lateral lean ITB stretch at wall 3 x 30"  MANUAL THERAPY: To promote normalized muscle tension, improved flexibility, increased ROM, and reduced pain. Skilled palpation and monitoring of soft tissue during DN Trigger Point Dry-Needling  Treatment instructions: Expect mild to moderate muscle soreness. Patient verbalized understanding of these instructions and education. Patient Consent Given: Yes Education handout provided: Yes Muscles treated: L VL (distal, mid & proximal) and L TFL Electrical stimulation performed: No Parameters: N/A Treatment response/outcome: Twitch Response Elicited and Palpable Increase in Muscle Length SMT/DTM, manual TPR and pin & stretch to muscles addressed with DN  MODALITIES: Ionotophoresis with 1.0 mL 436mml Dexamethasone - 4-6 hour patch (8066min) to L greater trochanter (patch #4 of 6)   01/21/22 THERAPEUTIC EXERCISE: to improve flexibility, strength and mobility.  Verbal and tactile cues throughout for technique. NuStep - L5 x 6 min L/R supine crossbody ITB stretch with strap 2 x 30" - cues to avoid trunk rotation L hooklying KTOS piriformis stretch 2 x 30" L hooklying figure-4 piriformis stretch with over-pressure 2 x 30" L seated KTOS piriformis stretch 2 x 30" L seated figure-4  piriformis stretch with over-pressure vs hip hinge x 30" each Hooklying GTB alt hip ABD/ER clam 10 x 5" Bridge + GTB hip abduction isometric 10 x 5" L sidelying GTB clam 10 x 3-5"  Standing RTB 4-way SLR 10 x 3"  MODALITIES: Ionotophoresis with 1.0 mL 4mg32m Dexamethasone - 4-6 hour patch (80mA76m) to L greater trochanter (patch #3 of 6)   01/18/22 THERAPEUTIC EXERCISE: to improve flexibility, strength and mobility.  Verbal and tactile cues throughout for technique. NuStep - L4 x 6 min L/R supine SKTC stretch 2 x 30" - cues to keep opp LE straight L/R hooklying HS stretch with strap 2 x 30" - cues to keep target LE straight L/R supine crossbody ITB stretch with strap 2 x 30" - cues to avoid trunk rotation L mod thomas hip stretch with strap 2 x 30" - cues to keep knee below hip  L/R hooklying KTOS piriformis stretch 2 x 30" Bridge + GTB hip abduction isometric 10 x 5" L sidelying GTB clam 10  x 3-5"  MANUAL THERAPY: To promote normalized muscle tension, improved flexibility, increased ROM, and reduced pain.  STM/DTM to L upper lateral glutes and TFL   MODALITIES: Ionotophoresis with 1.0 mL 334m/ml Dexamethasone - 4-6 hour patch (893mmin) to L greater trochanter (patch #2 of 6)   01/11/22 THERAPEUTIC EXERCISE: Instruction in initial HEP (see below) to improve flexibility, strength and mobility.  Verbal and tactile cues throughout for technique.  MODALITIES: Ionotophoresis with 1.0 mL 34m26ml Dexamethasone - 4-6 hour patch (77m61mn) to L greater trochanter (patch #1 of 6)   PATIENT EDUCATION:  Education details: role of DN and DN rational, procedure, outcomes, potential side effects, and recommended post-treatment exercises/activity Person educated: Patient Education method: Explanation and Handouts Education comprehension: verbalized understanding   HOME EXERCISE PROGRAM: AAccess Code: [email or web address] Date: 01/25/2022 Prepared by: JoAnAnnie Parasercises - Supine Single Knee to Chest Stretch  - 2-3 x daily - 7 x weekly - 3 reps - 30 sec hold - Hooklying Hamstring Stretch with Strap  - 2-3 x daily - 7 x weekly - 3 reps - 30 sec hold - Supine ITB Stretch with Strap  - 2-3 x daily - 7 x weekly - 3 reps - 30 sec hold - Supine Quadriceps Stretch with Strap on Table  - 2-3 x daily - 7 x weekly - 3 reps - 30 sec hold - Supine Piriformis Stretch with Foot on Ground  - 2-3 x daily - 7 x weekly - 3 reps - 30 sec hold - Bridge with Resistance  - 1 x daily - 7 x weekly - 2 sets - 10 reps - 5 sec hold - Clam with Resistance  - 1 x daily - 7 x weekly - 2 sets - 10 reps - 3-5 sec hold - Seated Figure 4 Piriformis Stretch  - 2-3 x daily - 7 x weekly - 3 reps - 30 sec hold - Seated Piriformis Stretch  - 2-3 x daily - 7 x weekly - 3 reps - 30 sec hold - Standing ITB Stretch  - 2-3 x daily - 7 x weekly - 3 reps - 30 sec hold - Standing Hip Flexion with Anchored Resistance and Chair Support  - 1 x daily - 3 x weekly - 2 sets - 10 reps - 3 sec hold - Standing Hip Adduction with Anchored Resistance  - 1 x daily - 3 x weekly - 2 sets - 10 reps - 3 sec hold - Standing Hip Extension with Anchored Resistance  - 1 x daily - 3 x weekly - 2 sets - 10 reps - 3 sec hold - Standing Hip Abduction with Anchored Resistance  - 1 x daily - 3 x weekly - 2 sets - 10 reps - 3 sec hold  Patient Education - Ionto Pt Instructions - [facility] - Trigger Point Dry Needling   ASSESSMENT:  CLINICAL IMPRESSION: DannKasandra Knudsenorted to session noting increased pain in L knee but almost full resolution from L hip pain. His L ITB and lateral musculature of the knee is very tight. Frequent cues required during MT to relax. Good response to stretches after being cued to avoid painful ROM. He was unable to do standing hip abduction d/t L knee pain, so we switched to the seated version w/ RTB. Pt has also met LTG #2 for L hip pain improvement.  OBJECTIVE IMPAIRMENTS: decreased  activity tolerance, decreased knowledge of condition, difficulty walking, decreased ROM, decreased strength, hypomobility, increased fascial restrictions, impaired perceived  functional ability, increased muscle spasms, impaired flexibility, and pain.   ACTIVITY LIMITATIONS: sleeping, stairs, transfers, and locomotion level  PARTICIPATION LIMITATIONS: interpersonal relationship, community activity, and yard work  PERSONAL FACTORS: Age, Past/current experiences, Time since onset of injury/illness/exacerbation, and 3+ comorbidities: Conversion of previous hip surgery to left total hip arthroplasty via posterior approach 10/07/20, TIA 01/08/21, HTN, HLD, DM, CAD, afib s/p ablation 08/18/21, pacemaker 02/22/21, OA  are also affecting patient's functional outcome.   REHAB POTENTIAL: Good  CLINICAL DECISION MAKING: Stable/uncomplicated  EVALUATION COMPLEXITY: Low   GOALS: Goals reviewed with patient? Yes  LONG TERM GOALS: Target date: 02/04/2022   Patient will be independent with advanced/ongoing HEP to improve outcomes and carryover.  Baseline:  Goal status: IN PROGRESS  2.  Patient will report at least 75% improvement in L hip pain to improve QOL. Baseline:  Goal status: MET - 01/28/22 (90%)  3.  Patient will demonstrate improved L proximal LE strength to >/= 4+/5 for improved stability and ease of mobility . Baseline:  Goal status: IN PROGRESS  4.  Patient to report ability to perform ADLs, household, and leisure activities without limitation due to L hip pain, LOM or weakness.  Baseline:  Goal status: IN PROGRESS  5.  Patient will report at least 9 point improvement on LEFS to demonstrate improved functional ability. Baseline: 55/80 = 68.8% Goal status: IN PROGRESS   PLAN: PT FREQUENCY: 2x/week  PT DURATION: 3 weeks  PLANNED INTERVENTIONS: Therapeutic exercises, Therapeutic activity, Neuromuscular re-education, Balance training, Gait training, Patient/Family education, Self Care,  Joint mobilization, Stair training, Dry Needling, Electrical stimulation, Cryotherapy, Moist heat, Taping, Ultrasound, Ionotophoresis 40m/ml Dexamethasone, Manual therapy, and Re-evaluation  PLAN FOR NEXT SESSION:  progress L hip/proximal LE flexibility and proximal LE strengthening & update HEP accordingly; MT +/- DN to address increased muscle tension/tightness in L hip musculature; modalities including ionto patch as indicated for pain   BArtist Pais PTA 01/28/2022, 8:49 AM

## 2022-02-01 ENCOUNTER — Ambulatory Visit: Payer: Medicare Other | Admitting: Physical Therapy

## 2022-02-01 ENCOUNTER — Encounter: Payer: Self-pay | Admitting: Physical Therapy

## 2022-02-01 DIAGNOSIS — R29898 Other symptoms and signs involving the musculoskeletal system: Secondary | ICD-10-CM

## 2022-02-01 DIAGNOSIS — M25552 Pain in left hip: Secondary | ICD-10-CM | POA: Diagnosis not present

## 2022-02-01 DIAGNOSIS — M25652 Stiffness of left hip, not elsewhere classified: Secondary | ICD-10-CM

## 2022-02-01 DIAGNOSIS — R262 Difficulty in walking, not elsewhere classified: Secondary | ICD-10-CM

## 2022-02-01 DIAGNOSIS — M6281 Muscle weakness (generalized): Secondary | ICD-10-CM

## 2022-02-01 NOTE — Therapy (Addendum)
OUTPATIENT PHYSICAL THERAPY TREATMENT / PROGRESS NOTE / DISCHARGE SUMMARY   Patient Name: Mike [name] MRN: [id number] DOB:[birth year], 71 y.o., male Today's Date: 02/01/2022  Progress Note  Reporting Period 01/11/2022 to 02/01/22  See note below for Objective Data and Assessment of Progress/Goals.       PT End of Session - 02/01/22 0802     Visit Number 6    Number of Visits 7    Date for PT Re-Evaluation 02/04/22    Authorization Type [employer]    PT Start Time 0802    PT Stop Time 0830    PT Time Calculation (min) 28 min    Activity Tolerance Patient tolerated treatment well    Behavior During Therapy WFL for tasks assessed/performed                  Past Medical History:  Diagnosis Date   Arthritis    hip, hands thumbs   Coronary artery disease    Diabetes mellitus without complication ([place])    Dysrhythmia 2012   A-Fib   History of kidney stones    Hyperlipidemia    Hypertension    Past Surgical History:  Procedure Laterality Date   TOTAL HIP ARTHROPLASTY Left 10/07/2020   Procedure: Conversion of previous hip surgery to left total hip arthroplasty-posterior approach;  Surgeon: Gaynelle Arabian, MD;  Location: [facility];  Service: Orthopedics;  Laterality: Left;  144min   Patient Active Problem List   Diagnosis Date Noted   OA (osteoarthritis) of hip 10/07/2020   S/P total left hip arthroplasty 10/07/2020    PCP: Virl Son., MD  REFERRING PROVIDER: Florinda Marker  REFERRING DIAG: [phone] (ICD-10-CM) - Presence of unspecified artificial hip joint  THERAPY DIAG:  Pain in left hip  Stiffness of left hip, not elsewhere classified  Muscle weakness (generalized)  Other symptoms and signs involving the musculoskeletal system  Difficulty in walking, not elsewhere classified  RATIONALE FOR EVALUATION AND TREATMENT: Rehabilitation  ONSET DATE:  ~May 2022  NEXT MD VISIT:  none scheduled   SUBJECTIVE:   SUBJECTIVE STATEMENT: Pt  requesting to make today his last visit. L knee pain from last visit now fully resolved.  PAIN:  Are you having pain? No  PERTINENT HISTORY: 10/07/20 - Conversion of previous hip surgery to left total hip arthroplasty-posterior approach; PMH significant for TIA 01/08/21, HTN, HLD, DM, CAD, afib s/p ablation 08/18/21, pacemaker 02/22/21, OA  PRECAUTIONS: ICD/Pacemaker    WEIGHT BEARING RESTRICTIONS: No  FALLS:  Has patient fallen in last 6 months? No  LIVING ENVIRONMENT: Lives with: lives with their spouse Lives in: House/apartment Stairs: Yes: External:  2 steps; post Has following equipment at home: Single point cane and Crutches  OCCUPATION: Retired   PLOF: Independent and Leisure: walking, working in the garden  PATIENT GOALS: "To keep the pain from coming back."   OBJECTIVE:   DIAGNOSTIC FINDINGS:  N/A  PATIENT SURVEYS:  LEFS 55/80 = 68.8%    COGNITION: Overall cognitive status: Within functional limits for tasks assessed     SENSATION: WFL  EDEMA:  N/A  MUSCLE LENGTH: Hamstrings: mod tight B ITB: mod tight B Piriformis: mod tight R>L Hip flexors: mod/severe tight L>R Quads: mod tight B  POSTURE:  No Significant postural limitations  PALPATION: TTP over L greater trochanter  LOWER EXTREMITY ROM:  Active ROM Right Eval 01/11/22 Left Eval 01/11/22  Hip flexion 105 94  Hip extension  limited  Hip abduction    Hip adduction  Hip internal rotation    Hip external rotation    Knee flexion    Knee extension    Ankle dorsiflexion    Ankle plantarflexion    Ankle inversion    Ankle eversion     (Blank rows = not tested)  LOWER EXTREMITY MMT:  MMT Right Eval 01/11/22 Left Eval 01/11/22  Right 02/01/22  Left 02/01/22  Hip flexion 4 4 4+ 4+  Hip extension 4 4- Limited motion 5 4+  Hip abduction 4 4- 5 4+  Hip adduction 4+ _0 Hip internal rotation 5 4+ 5 5  Hip external rotation 4 4- 4+ 4  Knee flexion 4+ 4+ 5 5  Knee extension _1 Ankle dorsiflexion _2 Ankle plantarflexion      Ankle inversion      Ankle eversion       (Blank rows = not tested)  LOWER EXTREMITY SPECIAL TESTS:  Hip special tests: Ober's test: positive   GAIT: Assistive device utilized: None Level of assistance: Complete Independence Gait pattern: WFL   TODAY'S TREATMENT:  02/01/22 THERAPEUTIC ACTIVITIES: LEFS = 74/80 = 92.5% MMT Goal assessment  SELF CARE: Review of recommended frequency for HEP  MODALITIES: Ionotophoresis with 1.0 mL 68m/ml Dexamethasone - 4-6 hour patch (837mmin) to L greater trochanter (patch #5 of 6)   01/28/22 Therapeutic Exercise: Nustep L5x6m72mThomas stretch 2x30 sec Supine ITB stretch 2x30" Supine piriformis KTOS and figure 4 stretch 2x30" sec Seated hip ABD w/ RTB  2 x 10  Manual Therapy: STM to L ITB attachment, distal VL and lateral hamstrings   01/25/22 VS: BP = 118/74, HR = 78, O2 sats = 95%  THERAPEUTIC EXERCISE: to improve flexibility, strength and mobility.  Verbal and tactile cues throughout for technique. NuStep - L5 x 6 min Standing lateral lean ITB stretch at wall 3 x 30"  MANUAL THERAPY: To promote normalized muscle tension, improved flexibility, increased ROM, and reduced pain. Skilled palpation and monitoring of soft tissue during DN Trigger Point Dry-Needling  Treatment instructions: Expect mild to moderate muscle soreness. Patient verbalized understanding of these instructions and education. Patient Consent Given: Yes Education handout provided: Yes Muscles treated: L VL (distal, mid & proximal) and L TFL Electrical stimulation performed: No Parameters: N/A Treatment response/outcome: Twitch Response Elicited and Palpable Increase in Muscle Length SMT/DTM, manual TPR and pin & stretch to muscles addressed with DN  MODALITIES: Ionotophoresis with 1.0 mL 4mg49m Dexamethasone - 4-6 hour patch (80mA60m) to L greater trochanter (patch #4 of 6)   PATIENT EDUCATION:   Education details: recommended frequency for ongoing HEP at discharge to prevent loss of gains achieved with PT Person educated: Patient Education method: Explanation Education comprehension: verbalized understanding   HOME EXERCISE PROGRAM: AAccess Code: [email or web address] Date: 01/25/2022 Prepared by: Mike [name] - Supine Single Knee to Chest Stretch  - 2-3 x daily - 7 x weekly - 3 reps - 30 sec hold - Hooklying Hamstring Stretch with Strap  - 2-3 x daily - 7 x weekly - 3 reps - 30 sec hold - Supine ITB Stretch with Strap  - 2-3 x daily - 7 x weekly - 3 reps - 30 sec hold - Supine Quadriceps Stretch with Strap on Table  - 2-3 x daily - 7 x weekly - 3 reps - 30 sec hold - Supine Piriformis Stretch with Foot on Ground  - 2-3 x daily - 7 x weekly -  3 reps - 30 sec hold - Bridge with Resistance  - 1 x daily - 7 x weekly - 2 sets - 10 reps - 5 sec hold - Clam with Resistance  - 1 x daily - 7 x weekly - 2 sets - 10 reps - 3-5 sec hold - Seated Figure 4 Piriformis Stretch  - 2-3 x daily - 7 x weekly - 3 reps - 30 sec hold - Seated Piriformis Stretch  - 2-3 x daily - 7 x weekly - 3 reps - 30 sec hold - Standing ITB Stretch  - 2-3 x daily - 7 x weekly - 3 reps - 30 sec hold - Standing Hip Flexion with Anchored Resistance and Chair Support  - 1 x daily - 3 x weekly - 2 sets - 10 reps - 3 sec hold - Standing Hip Adduction with Anchored Resistance  - 1 x daily - 3 x weekly - 2 sets - 10 reps - 3 sec hold - Standing Hip Extension with Anchored Resistance  - 1 x daily - 3 x weekly - 2 sets - 10 reps - 3 sec hold - Standing Hip Abduction with Anchored Resistance  - 1 x daily - 3 x weekly - 2 sets - 10 reps - 3 sec hold  Patient Education - Ionto Pt Instructions - [facility] - Trigger Point Dry Needling   ASSESSMENT:  CLINICAL IMPRESSION: Mike [name]" reports his L hip pain seems to be fully resolved and denies any issues limitations with normal daily  activities due to his hip. His LEFS has improved to 74/80 or 92.5% functional status. B LE strength has improved with all MMT 4+/5 or greater with only exception being L hip ER at 4/5. All goal now essentially met and Mike [name] would like to proceed with transition to his HEP at this time, but would like to remain on hold for 30-days in the event that issues arise that would necessitate a return to PT.   OBJECTIVE IMPAIRMENTS: decreased activity tolerance, decreased knowledge of condition, difficulty walking, decreased ROM, decreased strength, hypomobility, increased fascial restrictions, impaired perceived functional ability, increased muscle spasms, impaired flexibility, and pain.   ACTIVITY LIMITATIONS: sleeping, stairs, transfers, and locomotion level  PARTICIPATION LIMITATIONS: interpersonal relationship, community activity, and yard work  PERSONAL FACTORS: Age, Past/current experiences, Time since onset of injury/illness/exacerbation, and 3+ comorbidities: Conversion of previous hip surgery to left total hip arthroplasty via posterior approach 10/07/20, TIA 01/08/21, HTN, HLD, DM, CAD, afib s/p ablation 08/18/21, pacemaker 02/22/21, OA  are also affecting patient's functional outcome.   REHAB POTENTIAL: Good  CLINICAL DECISION MAKING: Stable/uncomplicated  EVALUATION COMPLEXITY: Low   GOALS: Goals reviewed with patient? Yes  LONG TERM GOALS: Target date: 02/04/2022   Patient will be independent with advanced/ongoing HEP to improve outcomes and carryover.  Baseline:  Goal status: MET  02/01/22  2.  Patient will report at least 75% improvement in L hip pain to improve QOL. Baseline:  Goal status: MET  01/28/22 (90%)  3.  Patient will demonstrate improved L proximal LE strength to >/= 4+/5 for improved stability and ease of mobility . Baseline:  Goal status: PARTIALLY MET  02/01/22 - Met except L hip ER 4/5  4.  Patient to report ability to perform ADLs, household, and leisure activities  without limitation due to L hip pain, LOM or weakness.  Baseline:  Goal status: MET  02/01/22  5.  Patient will report at least 9 point improvement on LEFS to demonstrate improved  functional ability. Baseline: 55/80 = 68.8% Goal status: MET  02/01/22 - LEFS = 74/80 = 92.5%   PLAN: PT FREQUENCY: 2x/week  PT DURATION: 3 weeks  PLANNED INTERVENTIONS: Therapeutic exercises, Therapeutic activity, Neuromuscular re-education, Balance training, Gait training, Patient/Family education, Self Care, Joint mobilization, Stair training, Dry Needling, Electrical stimulation, Cryotherapy, Moist heat, Taping, Ultrasound, Ionotophoresis 39m/ml Dexamethasone, Manual therapy, and Re-evaluation  PLAN FOR NEXT SESSION:  transition to HEP + 30-day hold   JPercival Spanish PT 02/01/2022, 8:41 AM   PHYSICAL THERAPY DISCHARGE SUMMARY  Visits from Start of Care: 6  Current functional level related to goals / functional outcomes:   Refer to above clinical impression and goal assessment for status as of last visit on 02/01/2022. Patient was placed on hold for 30 days and has not needed to return to PT, therefore will proceed with discharge from PT for this episode.    Remaining deficits:   As above.   Education / Equipment:   HEP   Patient agrees to discharge. Patient goals were mostly met. Patient is being discharged due to being pleased with the current functional level.  JPercival Spanish PT, MPT 04/14/22, 8:29 AM  [facility] [address]Phone: [phone]  Fax:  [phone]

## 2022-02-04 ENCOUNTER — Encounter: Payer: Medicare Other | Admitting: Physical Therapy

## 2022-02-08 ENCOUNTER — Encounter: Payer: Medicare Other | Admitting: Physical Therapy

## 2022-02-15 ENCOUNTER — Encounter: Payer: Medicare Other | Admitting: Physical Therapy

## 2022-05-16 ENCOUNTER — Other Ambulatory Visit: Payer: Self-pay | Admitting: Student

## 2022-05-16 DIAGNOSIS — M545 Low back pain, unspecified: Secondary | ICD-10-CM

## 2022-05-30 ENCOUNTER — Ambulatory Visit
Admission: RE | Admit: 2022-05-30 | Discharge: 2022-05-30 | Disposition: A | Discharge status: Home / Self Care | Payer: Medicare Other | Source: Ambulatory Visit | Attending: Student | Admitting: Student

## 2022-05-30 DIAGNOSIS — M545 Low back pain, unspecified: Secondary | ICD-10-CM

## 2022-05-30 MED ORDER — IOPAMIDOL (ISOVUE-M 200) INJECTION 41%
20.0000 mL | Freq: Once | INTRAMUSCULAR | Status: AC
  Administered 2022-05-30: 20 mL via INTRATHECAL

## 2022-05-30 MED ORDER — DIAZEPAM 5 MG PO TABS
5.0000 mg | ORAL_TABLET | Freq: Once | ORAL | Status: AC
  Administered 2022-05-30: 5 mg via ORAL

## 2022-05-30 MED ORDER — MEPERIDINE HCL 50 MG/ML IJ SOLN
50.0000 mg | Freq: Once | INTRAMUSCULAR | Status: AC | PRN
  Administered 2022-05-30: 50 mg via INTRAMUSCULAR

## 2022-05-30 MED ORDER — ONDANSETRON HCL 4 MG/2ML IJ SOLN
4.0000 mg | Freq: Once | INTRAMUSCULAR | Status: AC | PRN
  Administered 2022-05-30: 4 mg via INTRAMUSCULAR

## 2022-05-30 NOTE — Discharge Instructions (Signed)

## 2022-05-30 NOTE — Discharge Instr - Other Info (Addendum)
1320: pt reports pain 10/10 from myelogram procedure. See MAR 1334: pain 5/10 at time of return to nursing station, relief noted.

## 2022-08-12 HISTORY — PX: BACK SURGERY: SHX140

## 2023-04-07 IMAGING — DX DG PORTABLE PELVIS
1 series · 1 of 1 positions shown · non-contrast
Comparison: No prior.

CLINICAL DATA: Hip replacement.

EXAM:
PORTABLE PELVIS 1-2 VIEWS

[pelvis ap]
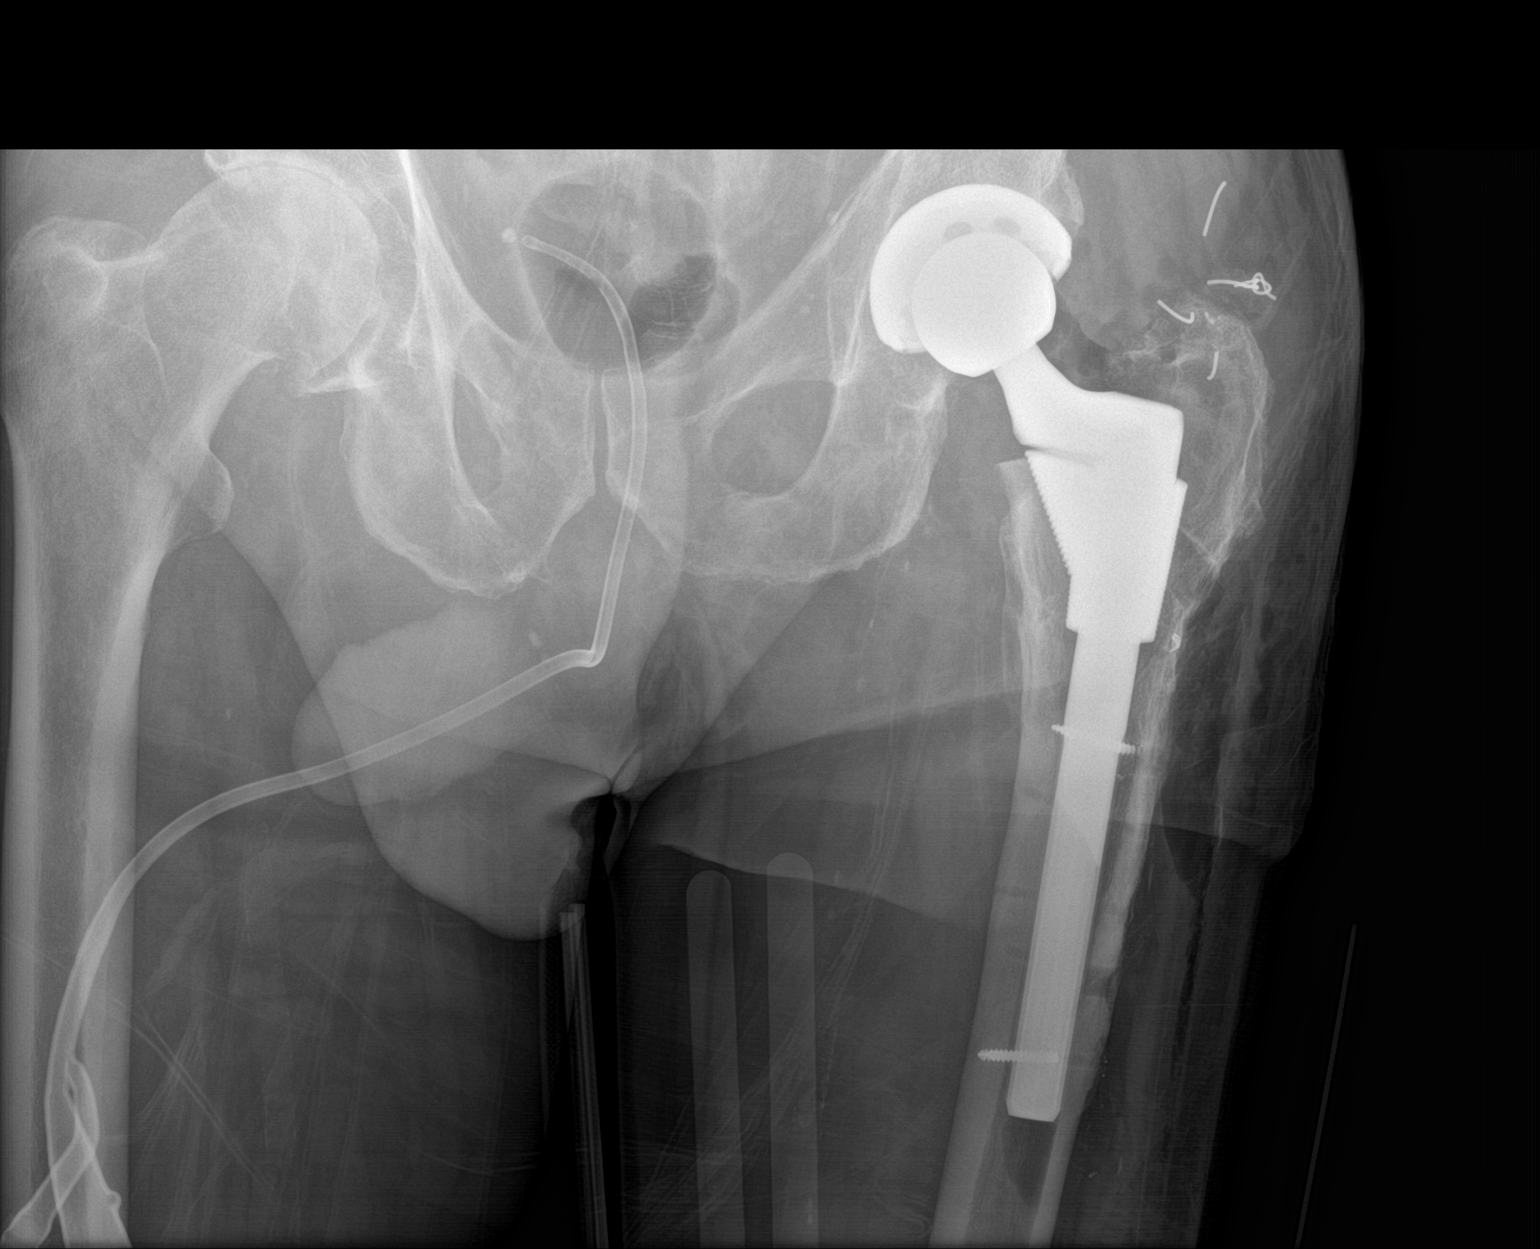

[1 of 1 positions shown; findings below may reference images not displayed]

FINDINGS: Total left hip replacement. Hardware intact. Anatomic alignment.
Wire fragments noted adjacent to the left hip. Catheter noted over
the bladder.
IMPRESSION: Total left hip replacement with anatomic alignment.

## 2023-08-20 NOTE — Progress Notes (Signed)
 COVID Vaccine received:  [x]  No []  Yes Date of any COVID positive Test in last 90 days:  none  PCP - Zannie Cove, MD at [facility] [phone] (Work)  [phone] (Fax)  Cardiologist - Sandy Salaam, MD  at [facility]  (next appt 08-25-2023) [phone] (Work) [phone] (Fax)  Pain Mgmt:  Sheran Luz, MD   Chest x-ray -  EKG - 10-07-20 Epic  Will repeat Stress Test -  ECHO -  Cardiac Cath -   PCR screen: [x]  Ordered & Completed []   No Order but Needs PROFEND     []   N/A for this surgery  Surgery Plan:  []  Ambulatory   [x]  Outpatient in bed  []  Admit Anesthesia:    []  General  []  Spinal  [x]   Choice []   MAC  Pacemaker  []  No [x]  Yes   Dr. Terri Skains, last check 06-01-23 CEW   dual-chamber  [facility] medical pacemaker with model number of  Assurity MRI 2272 and serial number of [id number].   DEVICE ORDERS ARE ON CHART.    Bryan Small notified  Spinal Cord Stimulator:[x]  No []  Yes       History of Sleep Apnea? [x]  No []  Yes   CPAP used?- [x]  No []  Yes    Does the patient monitor blood sugar?   []  N/A   [x]  No []  Yes  Patient has: []  NO Hx DM   [x]  Pre-DM   []  DM1  []   DM2 Last A1c was:  6.2  on  03-06-2023  CEW     Diabetic medications/ instructions: Metformin 500mg   bid,  hold dos  Blood Thinner / Instructions:Xarleto  hold x 72 hr per Naaman Plummer NP media note 06-05-23 Aspirin Instructions:   ASA 81 mg  stopped per patient  ERAS Protocol Ordered: []  No  [x]  Yes PRE-SURGERY []  ENSURE  [x]  G2   Patient is to be NPO after: 1030  Dental hx: []  Dentures:  [x]  N/A      []  Bridge or Partial:                   []  Loose or Damaged teeth:   Comments: Patient was given the 5 CHG shower / bath instructions for THA  surgery along with 2 bottles of the CHG soap. Patient will start this on: 08-26-2023  All questions were asked and answered, Patient voiced understanding of this process.   Activity level: Patient is able to climb a flight of stairs without difficulty;  [x]  No CP  but would have SOB, leg pain. Patient can perform ADLs without assistance.   Anesthesia review: A.Fib (ablations x4, last 08-18-21, multiple), PPM Rudolpho Sevin 02-22-21) CAD, HTN, CHF, Hx TIA, Pre-DM  Patient denies shortness of breath, fever, cough and chest pain at PAT appointment.  Patient verbalized understanding and agreement to the Pre-Surgical Instructions that were given to them at this PAT appointment. Patient was also educated of the need to review these PAT instructions again prior to his surgery.I reviewed the appropriate phone numbers to call if they have any and questions or concerns.

## 2023-08-20 NOTE — Patient Instructions (Signed)
 SURGICAL WAITING ROOM VISITATION Patients having surgery or a procedure may have no more than 2 support people in the waiting area - these visitors may rotate in the visitor waiting room.   Due to an increase in RSV and influenza rates and associated hospitalizations, children ages 33 and under may not visit patients in [facility] hospitals. If the patient needs to stay at the hospital during part of their recovery, the visitor guidelines for inpatient rooms apply.  PRE-OP VISITATION  Pre-op nurse will coordinate an appropriate time for 1 support person to accompany the patient in pre-op.  This support person may not rotate.  This visitor will be contacted when the time is appropriate for the visitor to come back in the pre-op area.  Please refer to the [facility] website for the visitor guidelines for Inpatients (after your surgery is over and you are in a regular room).  You are not required to quarantine at this time prior to your surgery. However, you must do this: Hand Hygiene often Do NOT share personal items Notify your provider if you are in close contact with someone who has COVID or you develop fever 100.4 or greater, new onset of sneezing, cough, sore throat, shortness of breath or body aches.  If you test positive for Covid or have been in contact with anyone that has tested positive in the last 10 days please notify you surgeon.    Your procedure is scheduled on:  August 30, 2023  Report to [facility] Main Entrance: Leota [facility]sen entrance where the [employer] is available.   Report to admitting at: 11:00    AM  Call this number if you have any questions or problems the morning of surgery [phone]  Do not eat food after Midnight the night prior to your surgery/procedure.  After Midnight you may have the following liquids until 10:30 AM DAY OF SURGERY  Clear Liquid Diet Water Black Coffee (sugar ok, NO MILK/CREAM OR CREAMERS)  Tea (sugar ok, NO MILK/CREAM OR  CREAMERS) regular and decaf                             Plain Jell-O  with no fruit (NO RED)                                           Fruit ices (not with fruit pulp, NO RED)                                     Popsicles (NO RED)                                                                  Juice: NO CITRUS JUICES: only apple, WHITE grape, WHITE cranberry Sports drinks like Gatorade or Powerade (NO RED)                   The day of surgery:  Drink ONE (1) Pre-Surgery G2 at  10:30 AM the morning of surgery. Drink in one sitting. Do  not sip.  This drink was given to you during your hospital pre-op appointment visit. Nothing else to drink after completing the Pre-Surgery G2 : No candy, chewing gum or throat lozenges.    FOLLOW ANY ADDITIONAL PRE OP INSTRUCTIONS YOU RECEIVED FROM YOUR SURGEON'S OFFICE!!!   Oral Hygiene is also important to reduce your risk of infection.        Remember - BRUSH YOUR TEETH THE MORNING OF SURGERY WITH YOUR REGULAR TOOTHPASTE  Do NOT smoke after Midnight the night before surgery.  XARELTO-  stop taking this medication 72 hours before your surgery. Last dose will be taken on Saturday 08-26-23.  ASPIRIN-  Stop taking this medication 5 days prior to surgery  STOP TAKING all Vitamins, Herbs and supplements 1 week before your surgery.   Take ONLY these medicines the morning of surgery with A SIP OF WATER: gabapentin, metoprolol, Clonazepam, Tikosyn and oxycodone if needed for pain.                       You may not have any metal on your body including  jewelry, and body piercing  Do not wear lotions, powders, cologne, or deodorant  Men may shave face and neck.  Contacts, Hearing Aids, dentures or bridgework may not be worn into surgery. DENTURES WILL BE REMOVED PRIOR TO SURGERY PLEASE DO NOT APPLY "Poly grip" OR ADHESIVES!!!  You may bring a small overnight bag with you on the day of surgery, only pack items that are not valuable. [facility] IS NOT  RESPONSIBLE   FOR VALUABLES THAT ARE LOST OR STOLEN.   Do not bring your home medications to the hospital. The Pharmacy will dispense medications listed on your medication list to you during your admission in the Hospital.  Please read over the following fact sheets you were given: IF YOU HAVE QUESTIONS ABOUT YOUR PRE-OP INSTRUCTIONS, PLEASE CALL [phone].     Pre-operative 5 CHG Bath Instructions   You can play a key role in reducing the risk of infection after surgery. Your skin needs to be as free of germs as possible. You can reduce the number of germs on your skin by washing with CHG (chlorhexidine gluconate) soap before surgery. CHG is an antiseptic soap that kills germs and continues to kill germs even after washing.   DO NOT use if you have an allergy to chlorhexidine/CHG or antibacterial soaps. If your skin becomes reddened or irritated, stop using the CHG and notify one of our RNs at [phone]  Please shower with the CHG soap starting 4 days before surgery using the following schedule: START SHOWERS ON   SATURDAY  15, 2025  Please keep in mind the following:  DO NOT shave, including legs and underarms, starting the day of your first shower.   You may shave your face at any point before/day of surgery.   Place clean sheets on your bed the day you start using CHG soap. Use a clean washcloth (not used since being washed) for each shower. DO NOT sleep with pets once you start using the CHG.   CHG Shower Instructions:  If you choose to wash your hair and private area, wash first with your normal shampoo/soap.  After you use shampoo/soap, rinse your hair and body thoroughly to remove shampoo/soap residue.  Turn the water OFF and apply about 3 tablespoons (45 ml) of CHG soap to a CLEAN washcloth.  Apply CHG  soap ONLY FROM YOUR NECK DOWN TO YOUR TOES (washing for 3-5 minutes)  DO NOT use CHG soap on face, private areas, open wounds, or sores.  Pay special attention to the area where your surgery is being performed.  If you are having back surgery, having someone wash your back for you may be helpful.  Wait 2 minutes after CHG soap is applied, then you may rinse off the CHG soap.  Pat dry with a clean towel  Put on clean clothes/pajamas   If you choose to wear lotion, please use ONLY the CHG-compatible lotions on the back of this paper.     Additional instructions for the day of surgery: DO NOT APPLY any lotions, deodorants, cologne, or perfumes.   Put on clean/comfortable clothes.  Brush your teeth.  Ask your nurse before applying any prescription medications to the skin.      CHG Compatible Lotions   Aveeno Moisturizing lotion  Cetaphil Moisturizing Cream  Cetaphil Moisturizing Lotion  Clairol Herbal Essence Moisturizing Lotion, Dry Skin  Clairol Herbal Essence Moisturizing Lotion, Extra Dry Skin  Clairol Herbal Essence Moisturizing Lotion, Normal Skin  Curel Age Defying Therapeutic Moisturizing Lotion with Alpha Hydroxy  Curel Extreme Care Body Lotion  Curel Soothing Hands Moisturizing Hand Lotion  Curel Therapeutic Moisturizing Cream, Fragrance-Free  Curel Therapeutic Moisturizing Lotion, Fragrance-Free  Curel Therapeutic Moisturizing Lotion, Original Formula  Eucerin Daily Replenishing Lotion  Eucerin Dry Skin Therapy Plus Alpha Hydroxy Crme  Eucerin Dry Skin Therapy Plus Alpha Hydroxy Lotion  Eucerin Original Crme  Eucerin Original Lotion  Eucerin Plus Crme Eucerin Plus Lotion  Eucerin TriLipid Replenishing Lotion  Keri Anti-Bacterial Hand Lotion  Keri Deep Conditioning Original Lotion Dry Skin Formula Softly Scented  Keri Deep Conditioning Original Lotion, Fragrance Free Sensitive Skin Formula  Keri Lotion Fast Absorbing Fragrance Free Sensitive Skin Formula  Keri  Lotion Fast Absorbing Softly Scented Dry Skin Formula  Keri Original Lotion  Keri Skin Renewal Lotion Keri Silky Smooth Lotion  Keri Silky Smooth Sensitive Skin Lotion  Nivea Body Creamy Conditioning Oil  Nivea Body Extra Enriched Lotion  Nivea Body Original Lotion  Nivea Body Sheer Moisturizing Lotion Nivea Crme  Nivea Skin Firming Lotion  NutraDerm 30 Skin Lotion  NutraDerm Skin Lotion  NutraDerm Therapeutic Skin Cream  NutraDerm Therapeutic Skin Lotion  ProShield Protective Hand Cream  Provon moisturizing lotion   FAILURE TO FOLLOW THESE INSTRUCTIONS MAY RESULT IN THE CANCELLATION OF YOUR SURGERY  PATIENT SIGNATURE_________________________________  NURSE SIGNATURE__________________________________  ________________________________________________________________________         Rogelia Mire    An incentive spirometer is a tool that can help keep your lungs clear and active. This tool measures how well you are filling your lungs with each  breath. Taking long deep breaths may help reverse or decrease the chance of developing breathing (pulmonary) problems (especially infection) following: A long period of time when you are unable to move or be active. BEFORE THE PROCEDURE  If the spirometer includes an indicator to show your best effort, your nurse or respiratory therapist will set it to a desired goal. If possible, sit up straight or lean slightly forward. Try not to slouch. Hold the incentive spirometer in an upright position. INSTRUCTIONS FOR USE  Sit on the edge of your bed if possible, or sit up as far as you can in bed or on a chair. Hold the incentive spirometer in an upright position. Breathe out normally. Place the mouthpiece in your mouth and seal your lips tightly around it. Breathe in slowly and as deeply as possible, raising the piston or the ball toward the top of the column. Hold your breath for 3-5 seconds or for as long as possible. Allow the  piston or ball to fall to the bottom of the column. Remove the mouthpiece from your mouth and breathe out normally. Rest for a few seconds and repeat Steps 1 through 7 at least 10 times every 1-2 hours when you are awake. Take your time and take a few normal breaths between deep breaths. The spirometer may include an indicator to show your best effort. Use the indicator as a goal to work toward during each repetition. After each set of 10 deep breaths, practice coughing to be sure your lungs are clear. If you have an incision (the cut made at the time of surgery), support your incision when coughing by placing a pillow or rolled up towels firmly against it. Once you are able to get out of bed, walk around indoors and cough well. You may stop using the incentive spirometer when instructed by your caregiver.  RISKS AND COMPLICATIONS Take your time so you do not get dizzy or light-headed. If you are in pain, you may need to take or ask for pain medication before doing incentive spirometry. It is harder to take a deep breath if you are having pain. AFTER USE Rest and breathe slowly and easily. It can be helpful to keep track of a log of your progress. Your caregiver can provide you with a simple table to help with this. If you are using the spirometer at home, follow these instructions: SEEK MEDICAL CARE IF:  You are having difficultly using the spirometer. You have trouble using the spirometer as often as instructed. Your pain medication is not giving enough relief while using the spirometer. You develop fever of 100.5 F (38.1 C) or higher.                                                                                                    SEEK IMMEDIATE MEDICAL CARE IF:  You cough up bloody sputum that had not been present before. You develop fever of 102 F (38.9 C) or greater. You develop worsening pain at or near the incision site. MAKE SURE YOU:  Understand these instructions. Will watch  your condition. Will get help right away if you are not doing well or get worse. Document Released: 10/10/2006 Document Revised: 08/22/2011 Document Reviewed: 12/11/2006 [facility] Patient Information 2014 [place], [place].        WHAT IS A BLOOD TRANSFUSION? Blood Transfusion Information  A transfusion is the replacement of blood or some of its parts. Blood is made up of multiple cells which provide different functions. Red blood cells carry oxygen and are used for blood loss replacement. White blood cells fight against infection. Platelets control bleeding. Plasma helps clot blood. Other blood products are available for specialized needs, such as hemophilia or other clotting disorders. BEFORE THE TRANSFUSION  Who gives blood for transfusions?  Healthy volunteers who are fully evaluated to make sure their blood is safe. This is blood bank blood. Transfusion therapy is the safest it has ever been in the practice of medicine. Before blood is taken from a donor, a complete history is taken to make sure that person has no history of diseases nor engages in risky social behavior (examples are intravenous drug use or sexual activity with multiple partners). The donor's travel history is screened to minimize risk of transmitting infections, such as malaria. The donated blood is tested for signs of infectious diseases, such as HIV and hepatitis. The blood is then tested to be sure it is compatible with you in order to minimize the chance of a transfusion reaction. If you or a relative donates blood, this is often done in anticipation of surgery and is not appropriate for emergency situations. It takes many days to process the donated blood. RISKS AND COMPLICATIONS Although transfusion therapy is very safe and saves many lives, the main dangers of transfusion include:  Getting an infectious disease. Developing a transfusion reaction. This is an allergic reaction to something in the blood you were  given. Every precaution is taken to prevent this. The decision to have a blood transfusion has been considered carefully by your caregiver before blood is given. Blood is not given unless the benefits outweigh the risks. AFTER THE TRANSFUSION Right after receiving a blood transfusion, you will usually feel much better and more energetic. This is especially true if your red blood cells have gotten low (anemic). The transfusion raises the level of the red blood cells which carry oxygen, and this usually causes an energy increase. The nurse administering the transfusion will monitor you carefully for complications. HOME CARE INSTRUCTIONS  No special instructions are needed after a transfusion. You may find your energy is better. Speak with your caregiver about any limitations on activity for underlying diseases you may have. SEEK MEDICAL CARE IF:  Your condition is not improving after your transfusion. You develop redness or irritation at the intravenous (IV) site. SEEK IMMEDIATE MEDICAL CARE IF:  Any of the following symptoms occur over the next 12 hours: Shaking chills. You have a temperature by mouth above 102 F (38.9 C), not controlled by medicine. Chest, back, or muscle pain. People around you feel you are not acting correctly or are confused. Shortness of breath or difficulty breathing. Dizziness and fainting. You get a rash or develop hives. You have a decrease in urine output. Your urine turns a dark color or changes to pink, red, or brown. Any of the following symptoms occur over the next 10 days: You have a temperature by mouth above 102 F (38.9 C), not controlled by medicine. Shortness of breath. Weakness after normal activity. The white part of the eye turns yellow (jaundice).  You have a decrease in the amount of urine or are urinating less often. Your urine turns a dark color or changes to pink, red, or brown. Document Released: 05/27/2000 Document Revised: 08/22/2011  Document Reviewed: 01/14/2008 [facility] Patient Information 2014 [place], [place].  _______________________________________________________________________        If you would like to see a video about joint replacement:   [email or web address]

## 2023-08-22 ENCOUNTER — Encounter (HOSPITAL_COMMUNITY): Payer: Self-pay

## 2023-08-22 ENCOUNTER — Encounter (HOSPITAL_COMMUNITY)
Admission: RE | Admit: 2023-08-22 | Discharge: 2023-08-22 | Disposition: A | Discharge status: Home / Self Care | Payer: Medicare Other | Source: Ambulatory Visit | Attending: Orthopedic Surgery | Admitting: Orthopedic Surgery

## 2023-08-22 ENCOUNTER — Other Ambulatory Visit: Payer: Self-pay

## 2023-08-22 VITALS — BP 133/88 | HR 70 | Temp 98.0°F | Resp 14 | Ht 73.5 in | Wt 200.0 lb

## 2023-08-22 DIAGNOSIS — Z79891 Long term (current) use of opiate analgesic: Secondary | ICD-10-CM

## 2023-08-22 DIAGNOSIS — M1611 Unilateral primary osteoarthritis, right hip: Secondary | ICD-10-CM | POA: Diagnosis not present

## 2023-08-22 DIAGNOSIS — Z01818 Encounter for other preprocedural examination: Secondary | ICD-10-CM | POA: Diagnosis present

## 2023-08-22 DIAGNOSIS — Z7901 Long term (current) use of anticoagulants: Secondary | ICD-10-CM | POA: Diagnosis not present

## 2023-08-22 DIAGNOSIS — I4891 Unspecified atrial fibrillation: Secondary | ICD-10-CM | POA: Insufficient documentation

## 2023-08-22 DIAGNOSIS — Z79899 Other long term (current) drug therapy: Secondary | ICD-10-CM | POA: Diagnosis not present

## 2023-08-22 HISTORY — DX: Myoneural disorder, unspecified: G70.9

## 2023-08-22 HISTORY — DX: Cerebral infarction, unspecified: I63.9

## 2023-08-22 HISTORY — DX: Anxiety disorder, unspecified: F41.9

## 2023-08-22 HISTORY — DX: Prediabetes: R73.03

## 2023-08-22 HISTORY — DX: Heart failure, unspecified: I50.9

## 2023-08-22 LAB — COMPREHENSIVE METABOLIC PANEL
ALT: 17 U/L (ref 0–44)
AST: 19 U/L (ref 15–41)
Albumin: 4.2 g/dL (ref 3.5–5.0)
Alkaline Phosphatase: 79 U/L (ref 38–126)
Anion gap: 8 (ref 5–15)
BUN: 19 mg/dL (ref 8–23)
CO2: 26 mmol/L (ref 22–32)
Calcium: 9.5 mg/dL (ref 8.9–10.3)
Chloride: 104 mmol/L (ref 98–111)
Creatinine, Ser: 0.96 mg/dL (ref 0.61–1.24)
GFR, Estimated: >60 mL/min (ref >60)
Glucose, Bld: 111 mg/dL — ABNORMAL HIGH (ref 70–99)
Potassium: 4.8 mmol/L (ref 3.5–5.1)
Sodium: 138 mmol/L (ref 135–145)
Total Bilirubin: 0.6 mg/dL (ref 0.0–1.2)
Total Protein: 7.2 g/dL (ref 6.5–8.1)

## 2023-08-22 LAB — CBC
HCT: 49.2 % (ref 39.0–52.0)
Hemoglobin: 15.6 g/dL (ref 13.0–17.0)
MCH: 31 pg (ref 26.0–34.0)
MCHC: 31.7 g/dL (ref 30.0–36.0)
MCV: 97.8 fL (ref 80.0–100.0)
Platelets: 206 10*3/uL (ref 150–400)
RBC: 5.03 MIL/uL (ref 4.22–5.81)
RDW: 12.1 % (ref 11.5–15.5)
WBC: 6.4 10*3/uL (ref 4.0–10.5)
nRBC: 0 % (ref 0.0–0.2)

## 2023-08-22 LAB — SURGICAL PCR SCREEN
MRSA, PCR: NEGATIVE
Staphylococcus aureus: POSITIVE — AB

## 2023-08-22 NOTE — Progress Notes (Signed)
 Patient's PCR screen is positive for STAPH. Appropriate notes have been placed on the patient's chart. This note has been routed to Dr. Lequita Halt and Arther Abbott, PA for review. The Patient's surgery is currently scheduled for: 08-30-2023 at [facility].  Rudean Haskell, BSN, CVRN-BC   Pre-Surgical Testing Nurse [facility]  [phone]

## 2023-08-23 ENCOUNTER — Encounter (HOSPITAL_COMMUNITY): Payer: Self-pay
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
## 2023-08-23 NOTE — Anesthesia Preprocedure Evaluation (Addendum)
 Anesthesia Evaluation  Patient identified by MRN, date of birth, ID band Patient awake    Reviewed: Allergy & Precautions, NPO status , Patient's Chart, lab work & pertinent test results, reviewed documented beta blocker date and time   Airway Mallampati: II  TM Distance: >3 FB     Dental no notable dental hx. (+) Teeth Intact, Caps, Dental Advisory Given   Pulmonary former smoker   Pulmonary exam normal breath sounds clear to auscultation       Cardiovascular hypertension, Pt. on medications and Pt. on home beta blockers + CAD and +CHF  + dysrhythmias Atrial Fibrillation + pacemaker  Rhythm:Regular Rate:Normal  S/P multiple ablations  EKG 08/22/23 Atrial paced rhythm, LAD, prolonged QT, LVH   Neuro/Psych   Anxiety     Peripheral neuropathy  Neuromuscular disease CVA, No Residual Symptoms    GI/Hepatic negative GI ROS, Neg liver ROS,,,  Endo/Other  diabetes, Well Controlled, Type 2, Oral Hypoglycemic Agents  HLD   Renal/GU negative Renal ROSHx/o renal calculi  negative genitourinary   Musculoskeletal  (+) Arthritis , Osteoarthritis,  OA right hip S/P Lumbar fusion   Abdominal   Peds  Hematology Xarelto therapy- last dose 3/16    Anesthesia Other Findings   Reproductive/Obstetrics                              Anesthesia Physical Anesthesia Plan  ASA: 3  Anesthesia Plan: Spinal   Post-op Pain Management: Minimal or no pain anticipated   Induction: Intravenous  PONV Risk Score and Plan: 2 and Treatment may vary due to age or medical condition, Propofol infusion and Ondansetron  Airway Management Planned: Natural Airway and Simple Face Mask  Additional Equipment: None  Intra-op Plan:   Post-operative Plan:   Informed Consent: I have reviewed the patients History and Physical, chart, labs and discussed the procedure including the risks, benefits and alternatives for the  proposed anesthesia with the patient or authorized representative who has indicated his/her understanding and acceptance.     Dental advisory given  Plan Discussed with: Anesthesiologist and CRNA  Anesthesia Plan Comments: (See PAT note from 3/11 by Sherlie Ban PA-C )         Anesthesia Quick Evaluation
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
## 2023-08-30 ENCOUNTER — Observation Stay (HOSPITAL_COMMUNITY)

## 2023-08-30 ENCOUNTER — Observation Stay (HOSPITAL_COMMUNITY)
Admission: RE | Admit: 2023-08-30 | Discharge: 2023-08-31 | Disposition: A | Discharge status: Home / Self Care | Payer: Medicare Other | Attending: Orthopedic Surgery | Admitting: Orthopedic Surgery

## 2023-08-30 ENCOUNTER — Ambulatory Visit (HOSPITAL_COMMUNITY): Payer: Self-pay | Admitting: Medical

## 2023-08-30 ENCOUNTER — Other Ambulatory Visit: Payer: Self-pay

## 2023-08-30 ENCOUNTER — Ambulatory Visit (HOSPITAL_COMMUNITY)

## 2023-08-30 ENCOUNTER — Encounter (HOSPITAL_COMMUNITY)
Admission: RE | Disposition: A | Discharge status: Home / Self Care | Payer: Self-pay | Source: Home / Self Care | Attending: Orthopedic Surgery

## 2023-08-30 ENCOUNTER — Ambulatory Visit (HOSPITAL_COMMUNITY): Admitting: Anesthesiology

## 2023-08-30 ENCOUNTER — Encounter (HOSPITAL_COMMUNITY): Payer: Self-pay | Admitting: Orthopedic Surgery

## 2023-08-30 DIAGNOSIS — Z7982 Long term (current) use of aspirin: Secondary | ICD-10-CM | POA: Diagnosis not present

## 2023-08-30 DIAGNOSIS — I2510 Atherosclerotic heart disease of native coronary artery without angina pectoris: Secondary | ICD-10-CM | POA: Diagnosis not present

## 2023-08-30 DIAGNOSIS — Z87891 Personal history of nicotine dependence: Secondary | ICD-10-CM | POA: Diagnosis not present

## 2023-08-30 DIAGNOSIS — I4891 Unspecified atrial fibrillation: Secondary | ICD-10-CM | POA: Insufficient documentation

## 2023-08-30 DIAGNOSIS — M1611 Unilateral primary osteoarthritis, right hip: Principal | ICD-10-CM | POA: Diagnosis present

## 2023-08-30 DIAGNOSIS — I509 Heart failure, unspecified: Secondary | ICD-10-CM

## 2023-08-30 DIAGNOSIS — Z79899 Other long term (current) drug therapy: Secondary | ICD-10-CM | POA: Diagnosis not present

## 2023-08-30 DIAGNOSIS — Z7984 Long term (current) use of oral hypoglycemic drugs: Secondary | ICD-10-CM | POA: Diagnosis not present

## 2023-08-30 DIAGNOSIS — Z96642 Presence of left artificial hip joint: Secondary | ICD-10-CM | POA: Diagnosis not present

## 2023-08-30 DIAGNOSIS — Z8673 Personal history of transient ischemic attack (TIA), and cerebral infarction without residual deficits: Secondary | ICD-10-CM | POA: Diagnosis not present

## 2023-08-30 DIAGNOSIS — I110 Hypertensive heart disease with heart failure: Secondary | ICD-10-CM

## 2023-08-30 DIAGNOSIS — M169 Osteoarthritis of hip, unspecified: Principal | ICD-10-CM | POA: Diagnosis present

## 2023-08-30 HISTORY — PX: TOTAL HIP ARTHROPLASTY: SHX124

## 2023-08-30 LAB — TYPE AND SCREEN
ABO/RH(D): A POS
Antibody Screen: NEGATIVE

## 2023-08-30 LAB — GLUCOSE, CAPILLARY
Glucose-Capillary: 114 mg/dL — ABNORMAL HIGH (ref 70–99)
Glucose-Capillary: 102 mg/dL — ABNORMAL HIGH (ref 70–99)
Glucose-Capillary: 165 mg/dL — ABNORMAL HIGH (ref 70–99)
Glucose-Capillary: 225 mg/dL — ABNORMAL HIGH (ref 70–99)

## 2023-08-30 LAB — HEMOGLOBIN A1C
Hgb A1c MFr Bld: 6.3 % — ABNORMAL HIGH (ref 4.8–5.6)
Mean Plasma Glucose: 134.11 mg/dL

## 2023-08-30 SURGERY — ARTHROPLASTY, HIP, TOTAL, ANTERIOR APPROACH
Anesthesia: Spinal | Site: Hip | Laterality: Right

## 2023-08-30 MED ORDER — LACTATED RINGERS IV SOLN: INTRAVENOUS | Status: DC

## 2023-08-30 MED ORDER — PRAVASTATIN SODIUM 20 MG PO TABS: 20.0000 mg | ORAL_TABLET | Freq: Every day | ORAL | Status: DC

## 2023-08-30 MED ORDER — MUPIROCIN 2 % EX OINT: 1.0000 | TOPICAL_OINTMENT | Freq: Two times a day (BID) | CUTANEOUS | 0 refills | Status: AC

## 2023-08-30 MED ORDER — ACETAMINOPHEN 10 MG/ML IV SOLN
1000.0000 mg | Freq: Once | INTRAVENOUS | Status: AC
  Administered 2023-08-30: 1000 mg via INTRAVENOUS
  Filled 2023-08-30: qty 100

## 2023-08-30 MED ORDER — METOPROLOL TARTRATE 25 MG PO TABS
25.0000 mg | ORAL_TABLET | Freq: Every day | ORAL | Status: DC
  Administered 2023-08-31: 25 mg via ORAL
  Filled 2023-08-30: qty 1

## 2023-08-30 MED ORDER — ASPIRIN 81 MG PO TBEC
81.0000 mg | DELAYED_RELEASE_TABLET | Freq: Every day | ORAL | Status: DC
  Administered 2023-08-31: 81 mg via ORAL
  Filled 2023-08-30: qty 1

## 2023-08-30 MED ORDER — DOFETILIDE 250 MCG PO CAPS
250.0000 ug | ORAL_CAPSULE | Freq: Two times a day (BID) | ORAL | Status: DC
  Administered 2023-08-30 – 2023-08-31 (×2): 250 ug via ORAL
  Filled 2023-08-30 (×3): qty 1

## 2023-08-30 MED ORDER — BUPIVACAINE IN DEXTROSE 0.75-8.25 % IT SOLN
INTRATHECAL | Status: DC | PRN
Start: 2023-08-30 — End: 2023-08-30
  Administered 2023-08-30: 2 mL via INTRATHECAL

## 2023-08-30 MED ORDER — FENTANYL CITRATE (PF) 100 MCG/2ML IJ SOLN
INTRAMUSCULAR | Status: AC
  Filled 2023-08-30: qty 2

## 2023-08-30 MED ORDER — CEFAZOLIN SODIUM-DEXTROSE 2-4 GM/100ML-% IV SOLN
2.0000 g | INTRAVENOUS | Status: AC
  Administered 2023-08-30: 2 g via INTRAVENOUS

## 2023-08-30 MED ORDER — POVIDONE-IODINE 10 % EX SWAB: 2.0000 | Freq: Once | CUTANEOUS | Status: DC

## 2023-08-30 MED ORDER — ONDANSETRON HCL 4 MG PO TABS: 4.0000 mg | ORAL_TABLET | Freq: Four times a day (QID) | ORAL | Status: DC | PRN

## 2023-08-30 MED ORDER — POLYETHYLENE GLYCOL 3350 17 G PO PACK: 17.0000 g | PACK | Freq: Every day | ORAL | Status: DC | PRN

## 2023-08-30 MED ORDER — METHOCARBAMOL 500 MG PO TABS
ORAL_TABLET | ORAL | Status: AC
  Filled 2023-08-30: qty 1

## 2023-08-30 MED ORDER — BUPIVACAINE-EPINEPHRINE (PF) 0.25% -1:200000 IJ SOLN
INTRAMUSCULAR | Status: DC | PRN
  Administered 2023-08-30: 30 mL

## 2023-08-30 MED ORDER — OXYCODONE HCL 5 MG PO TABS: 5.0000 mg | ORAL_TABLET | Freq: Once | ORAL | Status: DC | PRN

## 2023-08-30 MED ORDER — ONDANSETRON HCL 4 MG/2ML IJ SOLN
INTRAMUSCULAR | Status: DC | PRN
  Administered 2023-08-30: 4 mg via INTRAVENOUS

## 2023-08-30 MED ORDER — OXYCODONE HCL 5 MG/5ML PO SOLN: 5.0000 mg | Freq: Once | ORAL | Status: DC | PRN

## 2023-08-30 MED ORDER — HYDROMORPHONE HCL 1 MG/ML IJ SOLN
0.2500 mg | INTRAMUSCULAR | Status: DC | PRN
  Administered 2023-08-30 (×4): 0.5 mg via INTRAVENOUS

## 2023-08-30 MED ORDER — INSULIN ASPART 100 UNIT/ML IJ SOLN
0.0000 [IU] | Freq: Three times a day (TID) | INTRAMUSCULAR | Status: DC
  Administered 2023-08-30 – 2023-08-31 (×2): 3 [IU] via SUBCUTANEOUS
  Administered 2023-08-31: 2 [IU] via SUBCUTANEOUS

## 2023-08-30 MED ORDER — TRAMADOL HCL 50 MG PO TABS: 50.0000 mg | ORAL_TABLET | Freq: Four times a day (QID) | ORAL | Status: DC | PRN

## 2023-08-30 MED ORDER — PHENOL 1.4 % MT LIQD: 1.0000 | OROMUCOSAL | Status: DC | PRN

## 2023-08-30 MED ORDER — BUPIVACAINE-EPINEPHRINE (PF) 0.25% -1:200000 IJ SOLN
INTRAMUSCULAR | Status: AC
  Filled 2023-08-30: qty 30

## 2023-08-30 MED ORDER — 0.9 % SODIUM CHLORIDE (POUR BTL) OPTIME
TOPICAL | Status: DC | PRN
  Administered 2023-08-30: 1000 mL

## 2023-08-30 MED ORDER — BISACODYL 10 MG RE SUPP: 10.0000 mg | Freq: Every day | RECTAL | Status: DC | PRN

## 2023-08-30 MED ORDER — FLEET ENEMA RE ENEM: 1.0000 | ENEMA | Freq: Once | RECTAL | Status: DC | PRN

## 2023-08-30 MED ORDER — ONDANSETRON HCL 4 MG/2ML IJ SOLN: 4.0000 mg | Freq: Once | INTRAMUSCULAR | Status: DC | PRN

## 2023-08-30 MED ORDER — METOCLOPRAMIDE HCL 5 MG PO TABS: 5.0000 mg | ORAL_TABLET | Freq: Three times a day (TID) | ORAL | Status: DC | PRN

## 2023-08-30 MED ORDER — CHLORHEXIDINE GLUCONATE 0.12 % MT SOLN
15.0000 mL | Freq: Once | OROMUCOSAL | Status: AC
  Administered 2023-08-30: 15 mL via OROMUCOSAL

## 2023-08-30 MED ORDER — SODIUM CHLORIDE 0.9 % IV SOLN: INTRAVENOUS | Status: DC

## 2023-08-30 MED ORDER — CEFAZOLIN SODIUM-DEXTROSE 2-4 GM/100ML-% IV SOLN
2.0000 g | Freq: Four times a day (QID) | INTRAVENOUS | Status: AC
  Administered 2023-08-30 – 2023-08-31 (×2): 2 g via INTRAVENOUS
  Filled 2023-08-30 (×2): qty 100

## 2023-08-30 MED ORDER — HYDROMORPHONE HCL 1 MG/ML IJ SOLN
0.5000 mg | INTRAMUSCULAR | Status: DC | PRN
  Administered 2023-08-30 (×2): 1 mg via INTRAVENOUS
  Filled 2023-08-30 (×2): qty 1

## 2023-08-30 MED ORDER — METHOCARBAMOL 500 MG PO TABS
500.0000 mg | ORAL_TABLET | Freq: Four times a day (QID) | ORAL | Status: DC | PRN
  Administered 2023-08-30 – 2023-08-31 (×4): 500 mg via ORAL
  Filled 2023-08-30 (×3): qty 1

## 2023-08-30 MED ORDER — HYDROMORPHONE HCL 1 MG/ML IJ SOLN
INTRAMUSCULAR | Status: AC
  Filled 2023-08-30: qty 1

## 2023-08-30 MED ORDER — PHENYLEPHRINE HCL-NACL 20-0.9 MG/250ML-% IV SOLN
INTRAVENOUS | Status: DC | PRN
  Administered 2023-08-30: 30 ug/min via INTRAVENOUS
  Administered 2023-08-30: 160 ug via INTRAVENOUS

## 2023-08-30 MED ORDER — TRANEXAMIC ACID-NACL 1000-0.7 MG/100ML-% IV SOLN
1000.0000 mg | INTRAVENOUS | Status: AC
  Administered 2023-08-30: 1000 mg via INTRAVENOUS
  Filled 2023-08-30: qty 100

## 2023-08-30 MED ORDER — HYDROMORPHONE HCL 2 MG PO TABS
2.0000 mg | ORAL_TABLET | ORAL | Status: DC | PRN
  Administered 2023-08-30 – 2023-08-31 (×4): 4 mg via ORAL
  Filled 2023-08-30 (×4): qty 2

## 2023-08-30 MED ORDER — METOCLOPRAMIDE HCL 5 MG/ML IJ SOLN: 5.0000 mg | Freq: Three times a day (TID) | INTRAMUSCULAR | Status: DC | PRN

## 2023-08-30 MED ORDER — CLONAZEPAM 1 MG PO TABS
1.0000 mg | ORAL_TABLET | Freq: Two times a day (BID) | ORAL | Status: DC
  Administered 2023-08-30 – 2023-08-31 (×2): 1 mg via ORAL
  Filled 2023-08-30 (×2): qty 1

## 2023-08-30 MED ORDER — OXYCODONE HCL 5 MG PO TABS
10.0000 mg | ORAL_TABLET | Freq: Three times a day (TID) | ORAL | Status: DC | PRN
  Administered 2023-08-30 – 2023-08-31 (×3): 10 mg via ORAL
  Filled 2023-08-30 (×3): qty 2

## 2023-08-30 MED ORDER — RIVAROXABAN 10 MG PO TABS
10.0000 mg | ORAL_TABLET | Freq: Every day | ORAL | Status: DC
  Administered 2023-08-31: 10 mg via ORAL
  Filled 2023-08-30: qty 1

## 2023-08-30 MED ORDER — METHOCARBAMOL 1000 MG/10ML IJ SOLN: 500.0000 mg | Freq: Four times a day (QID) | INTRAMUSCULAR | Status: DC | PRN

## 2023-08-30 MED ORDER — DIPHENHYDRAMINE HCL 12.5 MG/5ML PO ELIX: 12.5000 mg | ORAL_SOLUTION | ORAL | Status: DC | PRN

## 2023-08-30 MED ORDER — WATER FOR IRRIGATION, STERILE IR SOLN
Status: DC | PRN
  Administered 2023-08-30: 1000 mL

## 2023-08-30 MED ORDER — DEXAMETHASONE SODIUM PHOSPHATE 10 MG/ML IJ SOLN
10.0000 mg | Freq: Once | INTRAMUSCULAR | Status: AC
  Administered 2023-08-31: 10 mg via INTRAVENOUS
  Filled 2023-08-30: qty 1

## 2023-08-30 MED ORDER — ONDANSETRON HCL 4 MG/2ML IJ SOLN: 4.0000 mg | Freq: Four times a day (QID) | INTRAMUSCULAR | Status: DC | PRN

## 2023-08-30 MED ORDER — EPHEDRINE SULFATE-NACL 50-0.9 MG/10ML-% IV SOSY
PREFILLED_SYRINGE | INTRAVENOUS | Status: DC | PRN
  Administered 2023-08-30 (×2): 7.5 mg via INTRAVENOUS

## 2023-08-30 MED ORDER — DEXAMETHASONE SODIUM PHOSPHATE 10 MG/ML IJ SOLN
8.0000 mg | Freq: Once | INTRAMUSCULAR | Status: AC
  Administered 2023-08-30: 8 mg via INTRAVENOUS

## 2023-08-30 MED ORDER — FENTANYL CITRATE (PF) 100 MCG/2ML IJ SOLN
INTRAMUSCULAR | Status: DC | PRN
  Administered 2023-08-30: 50 ug via INTRAVENOUS

## 2023-08-30 MED ORDER — ACETAMINOPHEN 325 MG PO TABS: 325.0000 mg | ORAL_TABLET | Freq: Four times a day (QID) | ORAL | Status: DC | PRN

## 2023-08-30 MED ORDER — ORAL CARE MOUTH RINSE: 15.0000 mL | Freq: Once | OROMUCOSAL | Status: AC

## 2023-08-30 MED ORDER — PROPOFOL 500 MG/50ML IV EMUL
INTRAVENOUS | Status: DC | PRN
  Administered 2023-08-30: 140 ug/kg/min via INTRAVENOUS

## 2023-08-30 MED ORDER — GABAPENTIN 400 MG PO CAPS
400.0000 mg | ORAL_CAPSULE | Freq: Three times a day (TID) | ORAL | Status: DC
  Administered 2023-08-30 – 2023-08-31 (×3): 400 mg via ORAL
  Filled 2023-08-30 (×3): qty 1

## 2023-08-30 MED ORDER — MENTHOL 3 MG MT LOZG: 1.0000 | LOZENGE | OROMUCOSAL | Status: DC | PRN

## 2023-08-30 MED ORDER — DOCUSATE SODIUM 100 MG PO CAPS
100.0000 mg | ORAL_CAPSULE | Freq: Two times a day (BID) | ORAL | Status: DC
  Administered 2023-08-30: 100 mg via ORAL
  Filled 2023-08-30 (×3): qty 1

## 2023-08-30 MED ORDER — ORAL CARE MOUTH RINSE: 15.0000 mL | OROMUCOSAL | Status: DC | PRN

## 2023-08-30 MED ORDER — INSULIN ASPART 100 UNIT/ML IJ SOLN
0.0000 [IU] | Freq: Every day | INTRAMUSCULAR | Status: DC
  Administered 2023-08-30: 2 [IU] via SUBCUTANEOUS

## 2023-08-30 MED ORDER — MECLIZINE HCL 25 MG PO TABS: 25.0000 mg | ORAL_TABLET | Freq: Three times a day (TID) | ORAL | Status: DC | PRN

## 2023-08-30 MED ORDER — CHLORHEXIDINE GLUCONATE 4 % EX SOLN: 1.0000 | CUTANEOUS | 1 refills | Status: AC

## 2023-08-30 SURGICAL SUPPLY — 37 items
BAG COUNTER SPONGE SURGICOUNT (BAG) IMPLANT
BAG ZIPLOCK 12X15 (MISCELLANEOUS) IMPLANT
BLADE SAG 18X100X1.27 (BLADE) ×1 IMPLANT
CATH URTH STD 16FR FL 2W DRN (CATHETERS) IMPLANT
COVER PERINEAL POST (MISCELLANEOUS) ×1 IMPLANT
COVER SURGICAL LIGHT HANDLE (MISCELLANEOUS) ×1 IMPLANT
CUP ACETBLR 54 OD PINNACLE (Hips) IMPLANT
DERMABOND ADVANCED .7 DNX12 (GAUZE/BANDAGES/DRESSINGS) ×1 IMPLANT
DRAPE FOOT SWITCH (DRAPES) ×1 IMPLANT
DRAPE STERI IOBAN 125X83 (DRAPES) ×1 IMPLANT
DRAPE U-SHAPE 47X51 STRL (DRAPES) ×2 IMPLANT
DRSG AQUACEL AG ADV 3.5X10 (GAUZE/BANDAGES/DRESSINGS) ×1 IMPLANT
DURAPREP 26ML APPLICATOR (WOUND CARE) ×1 IMPLANT
ELECT REM PT RETURN 15FT ADLT (MISCELLANEOUS) ×1 IMPLANT
GLOVE BIO SURGEON STRL SZ 6.5 (GLOVE) IMPLANT
GLOVE BIO SURGEON STRL SZ7 (GLOVE) IMPLANT
GLOVE BIO SURGEON STRL SZ8 (GLOVE) ×1 IMPLANT
GLOVE BIOGEL PI IND STRL 7.0 (GLOVE) IMPLANT
GLOVE BIOGEL PI IND STRL 8 (GLOVE) ×1 IMPLANT
GOWN STRL REUS W/ TWL LRG LVL3 (GOWN DISPOSABLE) ×2 IMPLANT
HEAD CERAMIC 36 PLUS5 (Hips) IMPLANT
HOLDER FOLEY CATH W/STRAP (MISCELLANEOUS) ×1 IMPLANT
KIT TURNOVER KIT A (KITS) IMPLANT
LINER MARATHON NEUT +4X54X36 (Hips) IMPLANT
MANIFOLD NEPTUNE II (INSTRUMENTS) ×1 IMPLANT
PACK ANTERIOR HIP CUSTOM (KITS) ×1 IMPLANT
PENCIL SMOKE EVACUATOR COATED (MISCELLANEOUS) ×1 IMPLANT
SPIKE FLUID TRANSFER (MISCELLANEOUS) ×1 IMPLANT
STEM FEM ACTIS HIGH SZ8 (Stem) IMPLANT
SUT ETHIBOND NAB CT1 #1 30IN (SUTURE) ×1 IMPLANT
SUT MNCRL AB 4-0 PS2 18 (SUTURE) ×1 IMPLANT
SUT STRATAFIX 0 PDS 27 VIOLET (SUTURE) ×1 IMPLANT
SUT VIC AB 2-0 CT1 TAPERPNT 27 (SUTURE) ×2 IMPLANT
SUTURE STRATFX 0 PDS 27 VIOLET (SUTURE) ×1 IMPLANT
TOWEL GREEN STERILE FF (TOWEL DISPOSABLE) ×1 IMPLANT
TRAY FOLEY MTR SLVR 16FR STAT (SET/KITS/TRAYS/PACK) ×1 IMPLANT
TUBE SUCTION HIGH CAP CLEAR NV (SUCTIONS) ×1 IMPLANT

## 2023-08-30 NOTE — Evaluation (Signed)
 Physical Therapy Evaluation Patient Details Name: Mike [name] MRN: [id number] DOB: 01-Oct-1950 Today's Date: 08/30/2023  History of Present Illness  Patient is 73 y.o. male presents to therapy s/p R THA, anterior approach on 08/30/2023 due to failure of conservative measures. Pt PMH includes but is not limited to: HTN, HLD, DM, TIA, B LE neuropathy, CAD, OA,  lumbar fusion (2024) and L THA (2022).  Clinical Impression    Mike [name] is a 73 y.o. male POD 0 s/p R THA. Patient reports mod I with mobility at baseline. Patient is now limited by functional impairments (see PT problem list below) and requires CGA for bed mobility and min A for transfers. Patient was able to ambulate 8 feet with RW and min A level of assist. Patient instructed in exercise to facilitate ROM and circulation to manage edema. Patient will benefit from continued skilled PT interventions to address impairments and progress towards PLOF. Acute PT will follow to progress mobility and stair training in preparation for safe discharge home with family support and HEP.       If plan is discharge home, recommend the following: A little help with walking and/or transfers;A little help with bathing/dressing/bathroom;Assistance with cooking/housework;Assist for transportation;Help with stairs or ramp for entrance   Can travel by private vehicle        Equipment Recommendations None recommended by PT  Recommendations for Other Services       Functional Status Assessment Patient has had a recent decline in their functional status and demonstrates the ability to make significant improvements in function in a reasonable and predictable amount of time.     Precautions / Restrictions Precautions Precautions: Fall Restrictions Weight Bearing Restrictions Per Provider Order: No      Mobility  Bed Mobility Overal bed mobility: Needs Assistance Bed Mobility: Supine to Sit     Supine to sit: Contact guard, HOB elevated,  Used rails     General bed mobility comments: min cues    Transfers Overall transfer level: Needs assistance Equipment used: Rolling walker (2 wheels) Transfers: Sit to/from Stand Sit to Stand: Min assist           General transfer comment: cues and min A for balance, pt making attempts to let go of RW with B UE support and noted anterior LOB limited self recovery stratagies and required min A and cues to maintain B UE on RW for safety and stabiltiy, pt recently had lumbar fusion and trunk flexion attributed to surgery. pt required min A for weight shifting to the R with B UE support at [facility]    Ambulation/Gait Ambulation/Gait assistance: Min assist, +2 physical assistance, +2 safety/equipment (recliner close) Gait Distance (Feet): 8 Feet Assistive device: Rolling walker (2 wheels) Gait Pattern/deviations: Step-to pattern, Knee hyperextension - right, Knee hyperextension - left, Antalgic, Trunk flexed Gait velocity: decreased     General Gait Details: noted deticits with B LE kinethetic awareness and difficulty advacing B LEs with limited foot clearance (attributed to slow regression of anethesia and hx of B LE peripherial neuropathy) and pt pushing RW anteriorly with B LE planted on floor and transitioning anteriorly compounded by trunk flexion with B LE in full extension, pt redirected and guided to recliner.  Stairs            Wheelchair Mobility     Tilt Bed    Modified Rankin (Stroke Patients Only)       Balance Overall balance assessment: Needs assistance Sitting-balance support: Bilateral upper extremity supported,  Feet supported Sitting balance-Leahy Scale: Fair     Standing balance support: Bilateral upper extremity supported, During functional activity, Reliant on assistive device for balance Standing balance-Leahy Scale: Poor                               Pertinent Vitals/Pain Pain Assessment Pain Assessment: 0-10 Pain Score: 7  Pain  Location: R hip and LE Pain Descriptors / Indicators: Aching, Constant, Discomfort, Grimacing, Operative site guarding Pain Intervention(s): Limited activity within patient's tolerance, Monitored during session, Premedicated before session, Repositioned, Ice applied    Home Living Family/patient expects to be discharged to:: Private residence Living Arrangements: Spouse/significant other Available Help at Discharge: Family Type of Home: House Home Access: Stairs to enter Entrance Stairs-Rails: None Entrance Stairs-Number of Steps: 2   Home Layout: One level Home Equipment: Agricultural consultant (2 wheels);Cane - single Acupuncturist (4 wheels)      Prior Function Prior Level of Function : Independent/Modified Independent             Mobility Comments: occational use of RW in home and use of rollator for outside navigation. mod I for all ALDs, self care tasks and IADLs       Extremity/Trunk Assessment        Lower Extremity Assessment Lower Extremity Assessment: RLE deficits/detail RLE Deficits / Details: ankle DF 5/5 and PF 4/5 RLE Sensation: decreased proprioception;decreased light touch;history of peripheral neuropathy    Cervical / Trunk Assessment Cervical / Trunk Assessment: Back Surgery  Communication   Communication Communication: No apparent difficulties    Cognition Arousal: Suspect due to medications, Lethargic Behavior During Therapy: Flat affect   PT - Cognitive impairments: No apparent impairments (slight confusion noted attributed to medications)                         Following commands: Intact       Cueing       General Comments      Exercises Total Joint Exercises Ankle Circles/Pumps: AROM, Both, 10 reps   Assessment/Plan    PT Assessment Patient needs continued PT services  PT Problem List Decreased strength;Decreased range of motion;Decreased activity tolerance;Decreased balance;Decreased  mobility;Decreased coordination;Pain       PT Treatment Interventions DME instruction;Gait training;Stair training;Functional mobility training;Therapeutic activities;Therapeutic exercise;Balance training;Neuromuscular re-education;Patient/family education;Modalities    PT Goals (Current goals can be found in the Care Plan section)  Acute Rehab PT Goals Patient Stated Goal: to be able to work in the yard, tend to the chickens and go thrifting PT Goal Formulation: With patient Time For Goal Achievement: 09/20/23 Potential to Achieve Goals: Good    Frequency 7X/week     Co-evaluation               AM-PAC PT "6 Clicks" Mobility  Outcome Measure Help needed turning from your back to your side while in a flat bed without using bedrails?: A Little Help needed moving from lying on your back to sitting on the side of a flat bed without using bedrails?: A Little Help needed moving to and from a bed to a chair (including a wheelchair)?: A Little Help needed standing up from a chair using your arms (e.g., wheelchair or bedside chair)?: A Little Help needed to walk in hospital room?: A Lot Help needed climbing 3-5 steps with a railing? : Total 6 Click Score: 15    End of Session  Equipment Utilized During Treatment: Gait belt Activity Tolerance: Patient limited by lethargy Patient left: in chair;with call bell/phone within reach;with nursing/sitter in room;with family/visitor present Nurse Communication: Mobility status PT Visit Diagnosis: Unsteadiness on feet (R26.81);Other abnormalities of gait and mobility (R26.89);Muscle weakness (generalized) (M62.81);Difficulty in walking, not elsewhere classified (R26.2);Pain Pain - Right/Left: Right Pain - part of body: Hip;Leg    Time: 2130-8657 PT Time Calculation (min) (ACUTE ONLY): 23 min   Charges:   PT Evaluation $PT Eval Low Complexity: 1 Low PT Treatments $Gait Training: 8-22 mins PT General Charges $$ ACUTE PT VISIT: 1  Visit         Johnny Bridge, PT Acute Rehab   Jacqualyn Posey 08/30/2023, 5:24 PM

## 2023-08-30 NOTE — Op Note (Signed)
 OPERATIVE REPORT- TOTAL HIP ARTHROPLASTY   PREOPERATIVE DIAGNOSIS: Osteoarthritis of the Right hip.   POSTOPERATIVE DIAGNOSIS: Osteoarthritis of the Right  hip.   PROCEDURE: Right total hip arthroplasty, anterior approach.   SURGEON: Ollen Gross, MD   ASSISTANT: Arther Abbott, PA-C  ANESTHESIA:  Spinal  ESTIMATED BLOOD LOSS:-250 mL    DRAINS: None  COMPLICATIONS: None   CONDITION: PACU - hemodynamically stable.   BRIEF CLINICAL NOTE: Mike [name] is a 73 y.o. male who has advanced end-  stage arthritis of their Right  hip with progressively worsening pain and  dysfunction.The patient has failed nonoperative management and presents for  total hip arthroplasty.   PROCEDURE IN DETAIL: After successful administration of spinal  anesthetic, the traction boots for the [facility] bed were placed on both  feet and the patient was placed onto the [facility] bed, boots placed into the leg  holders. The Right hip was then isolated from the perineum with plastic  drapes and prepped and draped in the usual sterile fashion. ASIS and  greater trochanter were marked and a oblique incision was made, starting  at about 1 cm lateral and 2 cm distal to the ASIS and coursing towards  the anterior cortex of the femur. The skin was cut with a 10 blade  through subcutaneous tissue to the level of the fascia overlying the  tensor fascia lata muscle. The fascia was then incised in line with the  incision at the junction of the anterior third and posterior 2/3rd. The  muscle was teased off the fascia and then the interval between the TFL  and the rectus was developed. The Hohmann retractor was then placed at  the top of the femoral neck over the capsule. The vessels overlying the  capsule were cauterized and the fat on top of the capsule was removed.  A Hohmann retractor was then placed anterior underneath the rectus  femoris to give exposure to the entire anterior capsule. A T-shaped   capsulotomy was performed. The edges were tagged and the femoral head  was identified.       Osteophytes are removed off the superior acetabulum.  The femoral neck was then cut in situ with an oscillating saw. Traction  was then applied to the left lower extremity utilizing the [facility]  traction. The femoral head was then removed. Retractors were placed  around the acetabulum and then circumferential removal of the labrum was  performed. Osteophytes were also removed. Reaming starts at 51 mm to  medialize and  Increased in 2 mm increments to 53 mm. We reamed in  approximately 40 degrees of abduction, 20 degrees anteversion. A 54 mm  pinnacle acetabular shell was then impacted in anatomic position under  fluoroscopic guidance with excellent purchase. We did not need to place  any additional dome screws. A 36 mm neutral + 4 marathon liner was then  placed into the acetabular shell.       The femoral lift was then placed along the lateral aspect of the femur  just distal to the vastus ridge. The leg was  externally rotated and capsule  was stripped off the inferior aspect of the femoral neck down to the  level of the lesser trochanter, this was done with electrocautery. The femur was lifted after this was performed. The  leg was then placed in an extended and adducted position essentially delivering the femur. We also removed the capsule superiorly and the piriformis from the piriformis fossa to gain  excellent exposure of the  proximal femur. Rongeur was used to remove some cancellous bone to get  into the lateral portion of the proximal femur for placement of the  initial starter reamer. The starter broaches was placed  the starter broach  and was shown to go down the center of the canal. Broaching  with the Actis system was then performed starting at size 0  coursing  Up to size 8. A size 8 had excellent torsional and rotational  and axial stability. The trial high offset neck was then placed   with a 36 + 5 trial head. The hip was then reduced. We confirmed that  the stem was in the canal both on AP and lateral x-rays. It also has excellent sizing. The hip was reduced with outstanding stability through full extension and full external rotation.. AP pelvis was taken and the leg lengths were measured and found to be equal. Hip was then dislocated again and the femoral head and neck removed. The  femoral broach was removed. Size 8 Actis stem with a high offset  neck was then impacted into the femur following native anteversion. Has  excellent purchase in the canal. Excellent torsional and rotational and  axial stability. It is confirmed to be in the canal on AP and lateral  fluoroscopic views. The 36 + 5 ceramic head was placed and the hip  reduced with outstanding stability. Again AP pelvis was taken and it  confirmed that the leg lengths were equal. The wound was then copiously  irrigated with saline solution and the capsule reattached and repaired  with Ethibond suture. 30 ml of .25% Bupivicaine was  injected into the capsule and into the edge of the tensor fascia lata as well as subcutaneous tissue. The fascia overlying the tensor fascia lata was then closed with a running #1 V-Loc. Subcu was closed with interrupted 2-0 Vicryl and subcuticular running 4-0 Monocryl. Incision was cleaned  and dried. Steri-Strips and a bulky sterile dressing applied. The patient was awakened and transported to  recovery in stable condition.        Please note that a surgical assistant was a medical necessity for this procedure to perform it in a safe and expeditious manner. Assistant was necessary to provide appropriate retraction of vital neurovascular structures and to prevent femoral fracture and allow for anatomic placement of the prosthesis.  Ollen Gross, M.D.

## 2023-08-30 NOTE — Interval H&P Note (Signed)
 History and Physical Interval Note:  08/30/2023 9:26 AM  Mike [name]  has presented today for surgery, with the diagnosis of right hip osteoarthritis.  The various methods of treatment have been discussed with the patient and family. After consideration of risks, benefits and other options for treatment, the patient has consented to  Procedure(s): ARTHROPLASTY, HIP, TOTAL, ANTERIOR APPROACH (Right) as a surgical intervention.  The patient's history has been reviewed, patient examined, no change in status, stable for surgery.  I have reviewed the patient's chart and labs.  Questions were answered to the patient's satisfaction.     [name]

## 2023-08-30 NOTE — Discharge Instructions (Signed)
°Mike Aluisio, MD °Total Joint Specialist °[facility] °[address] °[place], [place] [address] °[phone] ° °ANTERIOR APPROACH TOTAL HIP REPLACEMENT POSTOPERATIVE DIRECTIONS ° ° ° ° °Hip Rehabilitation, Guidelines Following Surgery  °The results of a hip operation are greatly improved after range of motion and muscle strengthening exercises. Follow all safety measures which are given to protect your hip. If any of these exercises cause increased pain or swelling in your joint, decrease the amount until you are comfortable again. Then slowly increase the exercises. Call your caregiver if you have problems or questions.  ° °HOME CARE INSTRUCTIONS  °Remove items at home which could result in a fall. This includes throw rugs or furniture in walking pathways.  °ICE to the affected hip as frequently as 20-30 minutes an hour and then as needed for pain and swelling. Continue to use ice on the hip for pain and swelling from surgery. You may notice swelling that will progress down to the foot and ankle. This is normal after surgery. Elevate the leg when you are not up walking on it.   °Continue to use the breathing machine which will help keep your temperature down.  It is common for your temperature to cycle up and down following surgery, especially at night when you are not up moving around and exerting yourself.  The breathing machine keeps your lungs expanded and your temperature down. ° °DIET °You may resume your previous home diet once your are discharged from the hospital. ° °DRESSING / WOUND CARE / SHOWERING °You have an adhesive waterproof bandage over the incision. Leave this in place until your first follow-up appointment. Once you remove this you will not need to place another bandage.  °You may begin showering 3 days following surgery, but do not submerge the incision under water. ° °ACTIVITY °For the first 3-5 days, it is important to rest and keep the operative leg elevated.  You should, as a general rule, rest for 50 minutes and walk/stretch for 10 minutes per hour. After 5 days, you may slowly increase activity as tolerated.  °Perform the exercises you were provided twice a day for about 15-20 minutes each session. Begin these 2 days following surgery. °Walk with your walker as instructed. Use the walker until you are comfortable transitioning to a cane. Walk with the cane in the opposite hand of the operative leg. You may discontinue the cane once you are comfortable and walking steadily. °Avoid periods of inactivity such as sitting longer than an hour when not asleep. This helps prevent blood clots.  °Do not drive a car for 6 weeks or until released by your surgeon.  °Do not drive while taking narcotics. ° °TED HOSE STOCKINGS °Wear the elastic stockings on both legs for three weeks following surgery during the day. You may remove them at night while sleeping. ° °WEIGHT BEARING °Weight bearing as tolerated with assist device (walker, cane, etc) as directed, use it as long as suggested by your surgeon or therapist, typically at least 4-6 weeks. ° °POSTOPERATIVE CONSTIPATION PROTOCOL °Constipation - defined medically as fewer than three stools per week and severe constipation as less than one stool per week. ° °One of the most common issues patients have following surgery is constipation.  Even if you have a regular bowel pattern at home, your normal regimen is likely to be disrupted due to multiple reasons following surgery.  Combination of anesthesia, postoperative narcotics, change in appetite and fluid intake all can affect your bowels.    In order to avoid complications following surgery, here are some recommendations in order to help you during your recovery period. ° °Colace (docusate) - Pick up an over-the-counter form of Colace or another stool softener and take twice a day as long as you are requiring postoperative pain medications.  Take with a full glass of water daily.  If  you experience loose stools or diarrhea, hold the colace until you stool forms back up.  If your symptoms do not get better within 1 week or if they get worse, check with your doctor. °Dulcolax (bisacodyl) - Pick up over-the-counter and take as directed by the product packaging as needed to assist with the movement of your bowels.  Take with a full glass of water.  Use this product as needed if not relieved by Colace only.  °MiraLax (polyethylene glycol) - Pick up over-the-counter to have on hand.  MiraLax is a solution that will increase the amount of water in your bowels to assist with bowel movements.  Take as directed and can mix with a glass of water, juice, soda, coffee, or tea.  Take if you go more than two days without a movement.Do not use MiraLax more than once per day. Call your doctor if you are still constipated or irregular after using this medication for 7 days in a row. ° °If you continue to have problems with postoperative constipation, please contact the office for further assistance and recommendations.  If you experience "the worst abdominal pain ever" or develop nausea or vomiting, please contact the office immediatly for further recommendations for treatment. ° °ITCHING ° If you experience itching with your medications, try taking only a single pain pill, or even half a pain pill at a time.  You can also use Benadryl over the counter for itching or also to help with sleep.  ° °MEDICATIONS °See your medication summary on the “After Visit Summary” that the nursing staff will review with you prior to discharge.  You may have some home medications which will be placed on hold until you complete the course of blood thinner medication.  It is important for you to complete the blood thinner medication as prescribed by your surgeon.  Continue your approved medications as instructed at time of discharge. ° °PRECAUTIONS °If you experience chest pain or shortness of breath - call 911 immediately for  transfer to the hospital emergency department.  °If you develop a fever greater that 101 F, purulent drainage from wound, increased redness or drainage from wound, foul odor from the wound/dressing, or calf pain - CONTACT YOUR SURGEON.   °                                                °FOLLOW-UP APPOINTMENTS °Make sure you keep all of your appointments after your operation with your surgeon and caregivers. You should call the office at the above phone number and make an appointment for approximately two weeks after the date of your surgery or on the date instructed by your surgeon outlined in the "After Visit Summary". ° °RANGE OF MOTION AND STRENGTHENING EXERCISES  °These exercises are designed to help you keep full movement of your hip joint. Follow your caregiver's or physical therapist's instructions. Perform all exercises about fifteen times, three times per day or as directed. Exercise both hips, even if you have had only   one joint replacement. These exercises can be done on a training (exercise) mat, on the floor, on a table or on a bed. Use whatever works the best and is most comfortable for you. Use music or television while you are exercising so that the exercises are a pleasant break in your day. This will make your life better with the exercises acting as a break in routine you can look forward to.  °Lying on your back, slowly slide your foot toward your buttocks, raising your knee up off the floor. Then slowly slide your foot back down until your leg is straight again.  °Lying on your back spread your legs as far apart as you can without causing discomfort.  °Lying on your side, raise your upper leg and foot straight up from the floor as far as is comfortable. Slowly lower the leg and repeat.  °Lying on your back, tighten up the muscle in the front of your thigh (quadriceps muscles). You can do this by keeping your leg straight and trying to raise your heel off the floor. This helps strengthen the  largest muscle supporting your knee.  °Lying on your back, tighten up the muscles of your buttocks both with the legs straight and with the knee bent at a comfortable angle while keeping your heel on the floor.  ° °POST-OPERATIVE OPIOID TAPER INSTRUCTIONS: °It is important to wean off of your opioid medication as soon as possible. If you do not need pain medication after your surgery it is ok to stop day one. °Opioids include: °Codeine, Hydrocodone(Norco, Vicodin), Oxycodone(Percocet, oxycontin) and hydromorphone amongst others.  °Long term and even short term use of opiods can cause: °Increased pain response °Dependence °Constipation °Depression °Respiratory depression °And more.  °Withdrawal symptoms can include °Flu like symptoms °Nausea, vomiting °And more °Techniques to manage these symptoms °Hydrate well °Eat regular healthy meals °Stay active °Use relaxation techniques(deep breathing, meditating, yoga) °Do Not substitute Alcohol to help with tapering °If you have been on opioids for less than two weeks and do not have pain than it is ok to stop all together.  °Plan to wean off of opioids °This plan should start within one week post op of your joint replacement. °Maintain the same interval or time between taking each dose and first decrease the dose.  °Cut the total daily intake of opioids by one tablet each day °Next start to increase the time between doses. °The last dose that should be eliminated is the evening dose.  ° °IF YOU ARE TRANSFERRED TO A SKILLED REHAB FACILITY °If the patient is transferred to a skilled rehab facility following release from the hospital, a list of the current medications will be sent to the facility for the patient to continue.  When discharged from the skilled rehab facility, please have the facility set up the patient's Home Health Physical Therapy prior to being released. Also, the skilled facility will be responsible for providing the patient with their medications at time of  release from the facility to include their pain medication, the muscle relaxants, and their blood thinner medication. If the patient is still at the rehab facility at time of the two week follow up appointment, the skilled rehab facility will also need to assist the patient in arranging follow up appointment in our office and any transportation needs. ° °MAKE SURE YOU:  °Understand these instructions.  °Get help right away if you are not doing well or get worse.  ° ° °DENTAL ANTIBIOTICS: ° °In most   cases prophylactic antibiotics for Dental procdeures after total joint surgery are not necessary. ° °Exceptions are as follows: ° °1. History of prior total joint infection ° °2. Severely immunocompromised (Organ Transplant, cancer chemotherapy, Rheumatoid biologic °meds such as Humera) ° °3. Poorly controlled diabetes (A1C &gt; 8.0, blood glucose over 200) ° °If you have one of these conditions, contact your surgeon for an antibiotic prescription, prior to your °dental procedure.  ° ° °Pick up stool softner and laxative for home use following surgery while on pain medications. °Do not submerge incision under water. °Please use good hand washing techniques while changing dressing each day. °May shower starting three days after surgery. °Please use a clean towel to pat the incision dry following showers. °Continue to use ice for pain and swelling after surgery. °Do not use any lotions or creams on the incision until instructed by your surgeon. ° °

## 2023-08-30 NOTE — Anesthesia Procedure Notes (Signed)
 Spinal  Patient location during procedure: OR Start time: 08/30/2023 10:59 AM End time: 08/30/2023 11:02 AM Reason for block: surgical anesthesia Staffing Performed by: Mal Amabile, MD Authorized by: Mal Amabile, MD   Preanesthetic Checklist Completed: patient identified, IV checked, site marked, risks and benefits discussed, surgical consent, monitors and equipment checked, pre-op evaluation and timeout performed Spinal Block Patient position: sitting Prep: DuraPrep and site prepped and draped Patient monitoring: heart rate, cardiac monitor, continuous pulse ox and blood pressure Approach: midline Location: L4-5 Injection technique: single-shot Needle Needle type: Pencan  Needle gauge: 24 G Needle length: 9 cm Needle insertion depth: 7 cm Assessment Sensory level: T4 Events: CSF return Additional Notes Patient tolerated procedure well. Adequate sensory level.

## 2023-08-30 NOTE — Anesthesia Postprocedure Evaluation (Signed)
 Anesthesia Post Note  Patient: Mike [name]  Procedure(s) Performed: ARTHROPLASTY, HIP, TOTAL, ANTERIOR APPROACH (Right: Hip)     Patient location during evaluation: PACU Anesthesia Type: Spinal Level of consciousness: oriented and awake and alert Pain management: pain level controlled Vital Signs Assessment: post-procedure vital signs reviewed and stable Respiratory status: spontaneous breathing, respiratory function stable and nonlabored ventilation Cardiovascular status: blood pressure returned to baseline and stable Postop Assessment: no headache, no backache, no apparent nausea or vomiting, spinal receding and patient able to bend at knees Anesthetic complications: no   No notable events documented.  Last Vitals:  Vitals:   08/30/23 1245 08/30/23 1300  BP: 116/74 (!) 127/91  Pulse: 68 70  Resp: 18 18  Temp:    SpO2: 96% 95%    Last Pain:  Vitals:   08/30/23 1300  TempSrc:   PainSc: (P) Asleep                 Milagros Middendorf A.

## 2023-08-30 NOTE — Transfer of Care (Signed)
 Immediate Anesthesia Transfer of Care Note  Patient: Mike [name]  Procedure(s) Performed: ARTHROPLASTY, HIP, TOTAL, ANTERIOR APPROACH (Right: Hip)  Patient Location: PACU  Anesthesia Type:MAC and Spinal  Level of Consciousness: sedated and drowsy  Airway & Oxygen Therapy: Patient Spontanous Breathing  Post-op Assessment: Report given to RN  Post vital signs: Reviewed and stable  Last Vitals:  Vitals Value Taken Time  BP 106/71 08/30/23 1241  Temp    Pulse 70 08/30/23 1244  Resp 18 08/30/23 1244  SpO2 95 % 08/30/23 1244  Vitals shown include unfiled device data.  Last Pain:  Vitals:   08/30/23 0939  TempSrc: Oral  PainSc:       Patients Stated Pain Goal: 2 (08/30/23 0856)  Complications: No notable events documented.

## 2023-08-31 ENCOUNTER — Encounter (HOSPITAL_COMMUNITY): Payer: Self-pay | Admitting: Orthopedic Surgery

## 2023-08-31 DIAGNOSIS — M1611 Unilateral primary osteoarthritis, right hip: Secondary | ICD-10-CM | POA: Diagnosis not present

## 2023-08-31 LAB — BASIC METABOLIC PANEL
Anion gap: 6 (ref 5–15)
BUN: 14 mg/dL (ref 8–23)
CO2: 25 mmol/L (ref 22–32)
Calcium: 9 mg/dL (ref 8.9–10.3)
Chloride: 106 mmol/L (ref 98–111)
Creatinine, Ser: 0.89 mg/dL (ref 0.61–1.24)
GFR, Estimated: >60 mL/min (ref >60)
Glucose, Bld: 131 mg/dL — ABNORMAL HIGH (ref 70–99)
Potassium: 3.8 mmol/L (ref 3.5–5.1)
Sodium: 137 mmol/L (ref 135–145)

## 2023-08-31 LAB — CBC
HCT: 40.8 % (ref 39.0–52.0)
Hemoglobin: 13.5 g/dL (ref 13.0–17.0)
MCH: 31.6 pg (ref 26.0–34.0)
MCHC: 33.1 g/dL (ref 30.0–36.0)
MCV: 95.6 fL (ref 80.0–100.0)
Platelets: 198 10*3/uL (ref 150–400)
RBC: 4.27 MIL/uL (ref 4.22–5.81)
RDW: 11.9 % (ref 11.5–15.5)
WBC: 10.4 10*3/uL (ref 4.0–10.5)
nRBC: 0 % (ref 0.0–0.2)

## 2023-08-31 LAB — GLUCOSE, CAPILLARY
Glucose-Capillary: 130 mg/dL — ABNORMAL HIGH (ref 70–99)
Glucose-Capillary: 152 mg/dL — ABNORMAL HIGH (ref 70–99)

## 2023-08-31 MED ORDER — TRAMADOL HCL 50 MG PO TABS: 50.0000 mg | ORAL_TABLET | Freq: Four times a day (QID) | ORAL | 0 refills | Status: DC | PRN

## 2023-08-31 MED ORDER — POTASSIUM CHLORIDE CRYS ER 20 MEQ PO TBCR
20.0000 meq | EXTENDED_RELEASE_TABLET | Freq: Once | ORAL | Status: AC
  Administered 2023-08-31: 20 meq via ORAL
  Filled 2023-08-31: qty 1

## 2023-08-31 MED ORDER — METHOCARBAMOL 500 MG PO TABS: 500.0000 mg | ORAL_TABLET | Freq: Four times a day (QID) | ORAL | 0 refills | Status: DC | PRN

## 2023-08-31 MED ORDER — ONDANSETRON HCL 4 MG PO TABS: 4.0000 mg | ORAL_TABLET | Freq: Four times a day (QID) | ORAL | 0 refills | Status: DC | PRN

## 2023-08-31 MED ORDER — HYDROMORPHONE HCL 2 MG PO TABS: 2.0000 mg | ORAL_TABLET | Freq: Three times a day (TID) | ORAL | 0 refills | Status: DC | PRN

## 2023-08-31 NOTE — Progress Notes (Signed)
 Physical Therapy Treatment Patient Details Name: Mike [name] MRN: [id number] DOB: 04-11-1951 Today's Date: 08/31/2023   History of Present Illness Patient is 73 y.o. male presents to therapy s/p R THA, anterior approach on 08/30/2023 due to failure of conservative measures. Pt PMH includes but is not limited to: HTN, HLD, DM, TIA, B LE neuropathy, CAD, OA,  lumbar fusion (2024) and L THA (2022).    PT Comments  Pt ambulated in hallway and performed LE exercises.  Pt reports increased pain however improved slightly with mobilizing.  Anticipate pt to d/c home later today after practicing steps.     If plan is discharge home, recommend the following: A little help with walking and/or transfers;A little help with bathing/dressing/bathroom;Assistance with cooking/housework;Assist for transportation;Help with stairs or ramp for entrance   Can travel by private vehicle        Equipment Recommendations  None recommended by PT    Recommendations for Other Services       Precautions / Restrictions Precautions Precautions: Fall Restrictions Weight Bearing Restrictions Per Provider Order: No     Mobility  Bed Mobility Overal bed mobility: Needs Assistance Bed Mobility: Supine to Sit     Supine to sit: Contact guard, HOB elevated     General bed mobility comments: cues for gait belt to self assist LE    Transfers Overall transfer level: Needs assistance Equipment used: Rolling walker (2 wheels) Transfers: Sit to/from Stand Sit to Stand: Contact guard assist           General transfer comment: verbal cues for UEand LE positioning for pain control    Ambulation/Gait Ambulation/Gait assistance: Contact guard assist Gait Distance (Feet): 120 Feet Assistive device: Rolling walker (2 wheels) Gait Pattern/deviations: Step-to pattern, Decreased stance time - right, Antalgic, Trunk flexed Gait velocity: decreased     General Gait Details: verbal cue for sequence, RW  positioning and posture; tend to have more forward flexed posture due to hx of back surgery per pt   Stairs             Wheelchair Mobility     Tilt Bed    Modified Rankin (Stroke Patients Only)       Balance                                            Communication Communication Communication: No apparent difficulties  Cognition Arousal: Alert Behavior During Therapy: WFL for tasks assessed/performed   PT - Cognitive impairments: No apparent impairments                         Following commands: Intact      Cueing    Exercises Total Joint Exercises Ankle Circles/Pumps: AROM, Both, 10 reps Quad Sets: AROM, Both, 10 reps Heel Slides: AAROM, Right, 10 reps Hip ABduction/ADduction: AROM, AAROM, Supine, Standing, Right, 10 reps, 5 reps, Other (comment) (10 reps AAROM supine, 5 reps AROM standing) Long Arc Quad: AROM, Right, Seated, 10 reps Knee Flexion: AROM, Right, Standing, 5 reps Marching in Standing: AROM, Right, Standing, 5 reps    General Comments        Pertinent Vitals/Pain Pain Assessment Pain Assessment: 0-10 Pain Score: 8  Pain Location: R hip and LE Pain Descriptors / Indicators: Aching, Sore, Burning, Guarding, Grimacing Pain Intervention(s): Repositioned, Monitored during session    Home Living  Prior Function            PT Goals (current goals can now be found in the care plan section) Progress towards PT goals: Progressing toward goals    Frequency    7X/week      PT Plan      Co-evaluation              AM-PAC PT "6 Clicks" Mobility   Outcome Measure  Help needed turning from your back to your side while in a flat bed without using bedrails?: A Little Help needed moving from lying on your back to sitting on the side of a flat bed without using bedrails?: A Little Help needed moving to and from a bed to a chair (including a wheelchair)?: A Little Help  needed standing up from a chair using your arms (e.g., wheelchair or bedside chair)?: A Little Help needed to walk in hospital room?: A Little Help needed climbing 3-5 steps with a railing? : A Little 6 Click Score: 18    End of Session Equipment Utilized During Treatment: Gait belt Activity Tolerance: Patient tolerated treatment well Patient left: in chair;with call bell/phone within reach;with chair alarm set Nurse Communication: Mobility status PT Visit Diagnosis: Other abnormalities of gait and mobility (R26.89)     Time: 1610-9604 PT Time Calculation (min) (ACUTE ONLY): 28 min  Charges:    $Gait Training: 8-22 mins $Therapeutic Exercise: 8-22 mins PT General Charges $$ ACUTE PT VISIT: 1 Visit                     Paulino Door, DPT Physical Therapist Acute Rehabilitation Services Office: [phone]  [name] [place] 08/31/2023, 10:49 AM

## 2023-08-31 NOTE — Care Management Obs Status (Signed)
 MEDICARE OBSERVATION STATUS NOTIFICATION   Patient Details  Name: Mike [name] MRN: [id number] Date of Birth: 1951/05/09   Medicare Observation Status Notification Given:  Yes    Adrian Prows, RN 08/31/2023, 10:31 AM

## 2023-08-31 NOTE — Progress Notes (Signed)
   Subjective: 1 Day Post-Op Procedure(s) (LRB): ARTHROPLASTY, HIP, TOTAL, ANTERIOR APPROACH (Right) Patient seen in rounds by Dr. Lequita Halt. Patient is well, and has had no acute complaints or problems. Denies SOB or chest pain. Denies calf pain. Foley cath removed this AM. Patient reports pain as moderate. Worked with physical therapy yesterday and ambulated 8'. We will continue physical therapy today.   Objective: Vital signs in last 24 hours: Temp:  [96.6 F (35.9 C)-97.7 F (36.5 C)] 97.5 F (36.4 C) (03/20 0637) Pulse Rate:  [68-77] 73 (03/20 0637) Resp:  [10-18] 17 (03/20 0637) BP: (106-157)/(71-100) 138/88 (03/20 0637) SpO2:  [92 %-100 %] 95 % (03/20 0637) Weight:  [90.2 kg] 90.2 kg (03/19 0856)  Intake/Output from previous day:  Intake/Output Summary (Last 24 hours) at 08/31/2023 0727 Last data filed at 08/31/2023 0629 Gross per 24 hour  Intake 2530.07 ml  Output 4200 ml  Net -1669.93 ml     Intake/Output this shift: No intake/output data recorded.  Labs: Recent Labs    08/31/23 0321  HGB 13.5   Recent Labs    08/31/23 0321  WBC 10.4  RBC 4.27  HCT 40.8  PLT 198   Recent Labs    08/31/23 0321  NA 137  K 3.8  CL 106  CO2 25  BUN 14  CREATININE 0.89  GLUCOSE 131*  CALCIUM 9.0   No results for input(s): "LABPT", "INR" in the last 72 hours.  Exam: General - Patient is Alert and Oriented Extremity - Neurologically intact Neurovascular intact Sensation intact distally Dorsiflexion/Plantar flexion intact Dressing - dressing C/D/I Motor Function - intact, moving foot and toes well on exam.  Past Medical History:  Diagnosis Date   Anxiety    Arthritis    hip, hands thumbs   CHF (congestive heart failure) (HCC)    Coronary artery disease    Dysrhythmia 2012   A-Fib   History of kidney stones    Hyperlipidemia    Hypertension    Neuromuscular disorder (HCC)    neuropathy down both legs   Pre-diabetes    Stroke (HCC)    mild TIA     Assessment/Plan: 1 Day Post-Op Procedure(s) (LRB): ARTHROPLASTY, HIP, TOTAL, ANTERIOR APPROACH (Right) Principal Problem:   OA (osteoarthritis) of hip Active Problems:   Osteoarthritis of right hip  Estimated body mass index is 25.87 kg/m as calculated from the following:   Height as of this encounter: 6' 1.5" (1.867 m).   Weight as of this encounter: 90.2 kg. Advance diet Up with therapy D/C IV fluids  DVT Prophylaxis - Aspirin and Xarelto Weight bearing as tolerated.  Continue physical therapy. Expected discharge home today pending progress with physical therapy and if meeting patient goals. Will do HEP once discharged. Follow-up in clinic in 2 weeks.  The PDMP database was reviewed today prior to any opioid medications being prescribed to this patient.  R. Arcola Jansky, PA-C Orthopedic Surgery 08/31/2023, 7:27 AM

## 2023-08-31 NOTE — Progress Notes (Signed)
 Physical Therapy Treatment Patient Details Name: Mike [name] MRN: [id number] DOB: 08/30/1950 Today's Date: 08/31/2023   History of Present Illness Patient is 73 y.o. male presents to therapy s/p R THA, anterior approach on 08/30/2023 due to failure of conservative measures. Pt PMH includes but is not limited to: HTN, HLD, DM, TIA, B LE neuropathy, CAD, OA,  lumbar fusion (2024) and L THA (2022).    PT Comments  Spouse present this afternoon and assisted with holding RW for stair negotiation.  Pt and spouse educated on safe stair technique.  Pt provided with HEP and stair handouts.  Pt feels ready for d/c home today.  Pt and spouse had no further questions.     If plan is discharge home, recommend the following: A little help with walking and/or transfers;A little help with bathing/dressing/bathroom;Assistance with cooking/housework;Assist for transportation;Help with stairs or ramp for entrance   Can travel by private vehicle        Equipment Recommendations  None recommended by PT    Recommendations for Other Services       Precautions / Restrictions Precautions Precautions: Fall     Mobility  Bed Mobility Overal bed mobility: Needs Assistance Bed Mobility: Supine to Sit     Supine to sit: Contact guard, HOB elevated     General bed mobility comments: cues for gait belt to self assist LE    Transfers Overall transfer level: Needs assistance Equipment used: Rolling walker (2 wheels) Transfers: Sit to/from Stand Sit to Stand: Contact guard assist           General transfer comment: verbal cues for UEand LE positioning for pain control    Ambulation/Gait Ambulation/Gait assistance: Contact guard assist Gait Distance (Feet): 200 Feet Assistive device: Rolling walker (2 wheels) Gait Pattern/deviations: Step-to pattern, Decreased stance time - right, Antalgic, Trunk flexed Gait velocity: decreased     General Gait Details: verbal cue for sequence, RW  positioning and posture; tend to have more forward flexed posture due to hx of back surgery per pt   Stairs Stairs: Yes Stairs assistance: Contact guard assist Stair Management: Step to pattern, Backwards, With walker Number of Stairs: 2 General stair comments: verbal cues for sequence, safety, RW positioning; spouse present and held RW; pt and spouse report understanding and provided with handout   Wheelchair Mobility     Tilt Bed    Modified Rankin (Stroke Patients Only)       Balance                                            Communication Communication Communication: No apparent difficulties  Cognition Arousal: Alert Behavior During Therapy: WFL for tasks assessed/performed   PT - Cognitive impairments: No apparent impairments                         Following commands: Intact      Cueing    Exercises    General Comments        Pertinent Vitals/Pain Pain Assessment Pain Assessment: 0-10 Pain Score: 7  Pain Location: R hip Pain Descriptors / Indicators: Aching, Sore, Burning, Guarding, Grimacing Pain Intervention(s): Repositioned, Monitored during session, Ice applied    Home Living  Prior Function            PT Goals (current goals can now be found in the care plan section) Progress towards PT goals: Progressing toward goals    Frequency    7X/week      PT Plan      Co-evaluation              AM-PAC PT "6 Clicks" Mobility   Outcome Measure  Help needed turning from your back to your side while in a flat bed without using bedrails?: A Little Help needed moving from lying on your back to sitting on the side of a flat bed without using bedrails?: A Little Help needed moving to and from a bed to a chair (including a wheelchair)?: A Little Help needed standing up from a chair using your arms (e.g., wheelchair or bedside chair)?: A Little Help needed to walk in hospital  room?: A Little Help needed climbing 3-5 steps with a railing? : A Little 6 Click Score: 18    End of Session Equipment Utilized During Treatment: Gait belt Activity Tolerance: Patient tolerated treatment well Patient left: in chair;with call bell/phone within reach;with family/visitor present   PT Visit Diagnosis: Other abnormalities of gait and mobility (R26.89)     Time: 1610-9604 PT Time Calculation (min) (ACUTE ONLY): 12 min  Charges:    $Gait Training: 8-22 mins PT General Charges $$ ACUTE PT VISIT: 1 Visit                    Paulino Door, DPT Physical Therapist Acute Rehabilitation Services Office: [phone]  Mike [name] 08/31/2023, 2:55 PM

## 2023-08-31 NOTE — TOC Initial Note (Signed)
 Transition of Care [facility]) - Initial/Assessment Note    Patient Details  Name: Mike [name] MRN: [id number] Date of Birth: 1950-10-22  [facility] CM/SW Contact:    Adrian Prows, RN Phone Number: 08/31/2023, 11:11 AM  Clinical Narrative:                 Spoke w/ pt in room; pt says he lives at home w/ his wife Mike [name] [phone]); he plans to return at d/c; pt says his wife will provide transportation; pt verified insurance/PCP; he denies SDOH risk; pt says he has cane and walker; he does not have HH services or home oxygen; no TOC needs.  Expected Discharge Plan: Home/Self Care Barriers to Discharge: No Barriers Identified   Patient Goals and CMS Choice Patient states their goals for this hospitalization and ongoing recovery are:: home CMS Medicare.gov Compare Post Acute Care list provided to:: Patient        Expected Discharge Plan and Services   Discharge Planning Services: CM Consult Post Acute Care Choice: NA Living arrangements for the past 2 months: Single Family Home Expected Discharge Date: 08/31/23               DME Arranged: N/A DME Agency: NA       HH Arranged: NA HH Agency: NA        Prior Living Arrangements/Services Living arrangements for the past 2 months: Single Family Home Lives with:: Spouse Patient language and need for interpreter reviewed:: Yes Do you feel safe going back to the place where you live?: Yes      Need for Family Participation in Patient Care: Yes (Comment) Care giver support system in place?: Yes (comment) Current home services: DME (cane, walker) Criminal Activity/Legal Involvement Pertinent to Current Situation/Hospitalization: No - Comment as needed  Activities of Daily Living   ADL Screening (condition at time of admission) Independently performs ADLs?: Yes (appropriate for developmental age) Is the patient deaf or have difficulty hearing?: Yes Does the patient have difficulty seeing,  even when wearing glasses/contacts?: No Does the patient have difficulty concentrating, remembering, or making decisions?: No  Permission Sought/Granted Permission sought to share information with : Case Manager Permission granted to share information with : Yes, Verbal Permission Granted  Share Information with NAME: Case Manager     Permission granted to share info w Relationship: Dajaun Goldring (spouse) [phone]     Emotional Assessment Appearance:: Appears stated age Attitude/Demeanor/Rapport: Gracious Affect (typically observed): Accepting Orientation: : Oriented to Self, Oriented to Place, Oriented to  Time, Oriented to Situation Alcohol / Substance Use: Not Applicable Psych Involvement: No (comment)  Admission diagnosis:  Osteoarthritis of right hip [M16.11] Patient Active Problem List   Diagnosis Date Noted   Osteoarthritis of right hip 08/30/2023   OA (osteoarthritis) of hip 10/07/2020   S/P total left hip arthroplasty 10/07/2020   PCP:  Zannie Cove, MD Pharmacy:   [id number] - [address] [place], [place] - [address] [place] [address] Phone: [phone] Fax: [phone]     Social Drivers of Health (SDOH) Social History: SDOH Screenings   Food Insecurity: No Food Insecurity (08/31/2023)  Housing: Low Risk  (08/31/2023)  Transportation Needs: No Transportation Needs (08/31/2023)  Recent Concern: Transportation Needs - Unmet Transportation Needs (08/30/2023)  Utilities: Not At Risk (08/31/2023)  Financial Resource Strain: Low Risk  (08/29/2023)   Received from [facility]  Physical Activity: Insufficiently Active (03/05/2023)   [facility] from [facility]  Social Connections: Moderately Integrated (08/30/2023)  Stress: No Stress Concern Present (04/04/2023)   Received from [facility]  Tobacco Use: Medium Risk (08/30/2023)   SDOH Interventions: Food Insecurity Interventions: Intervention Not Indicated, Inpatient  TOC Housing Interventions: Intervention Not Indicated, Inpatient TOC Transportation Interventions: Intervention Not Indicated, Inpatient TOC Utilities Interventions: Intervention Not Indicated, Inpatient TOC   Readmission Risk Interventions     No data to display

## 2023-09-04 NOTE — Discharge Summary (Signed)
 Physician Discharge Summary   Patient ID: Mike [name] MRN: [id number] DOB/AGE: May 24, 1951 73 y.o.  Admit date: 08/30/2023 Discharge date: 08/31/2023  Primary Diagnosis: Osteoarthritis of the right hip   Admission Diagnoses:  Past Medical History:  Diagnosis Date   Anxiety    Arthritis    hip, hands thumbs   CHF (congestive heart failure) (HCC)    Coronary artery disease    Dysrhythmia 2012   A-Fib   History of kidney stones    Hyperlipidemia    Hypertension    Neuromuscular disorder (HCC)    neuropathy down both legs   Pre-diabetes    Stroke (HCC)    mild TIA   Discharge Diagnoses:   Principal Problem:   OA (osteoarthritis) of hip Active Problems:   Osteoarthritis of right hip  Estimated body mass index is 25.87 kg/m as calculated from the following:   Height as of this encounter: 6' 1.5" (1.867 m).   Weight as of this encounter: 90.2 kg.  Procedure:  Procedure(s) (LRB): ARTHROPLASTY, HIP, TOTAL, ANTERIOR APPROACH (Right)   Consults: None  HPI: Mike [name] is a 73 y.o. male who has advanced end-stage arthritis of their Right  hip with progressively worsening pain and dysfunction.The patient has failed nonoperative management and presents for total hip arthroplasty.   Laboratory Data: Admission on 08/30/2023, Discharged on 08/31/2023  Component Date Value Ref Range Status   Glucose-Capillary 08/30/2023 114 (H)  70 - 99 mg/dL Final   Glucose reference range applies only to samples taken after fasting for at least 8 hours.   Glucose-Capillary 08/30/2023 102 (H)  70 - 99 mg/dL Final   Glucose reference range applies only to samples taken after fasting for at least 8 hours.   Comment 1 08/30/2023 Notify RN   Final   Comment 2 08/30/2023 Document in Chart   Final   Hgb A1c MFr Bld 08/30/2023 6.3 (H)  4.8 - 5.6 % Final   Comment: (NOTE) Pre diabetes:          5.7%-6.4%  Diabetes:              >6.4%  Glycemic control for   <7.0% adults with diabetes     Mean Plasma Glucose 08/30/2023 134.11  mg/dL Final   Performed at [facility], [address], [place], [place] [address]   Glucose-Capillary 08/30/2023 165 (H)  70 - 99 mg/dL Final   Glucose reference range applies only to samples taken after fasting for at least 8 hours.   WBC 08/31/2023 10.4  4.0 - 10.5 K/uL Final   RBC 08/31/2023 4.27  4.22 - 5.81 MIL/uL Final   Hemoglobin 08/31/2023 13.5  13.0 - 17.0 g/dL Final   HCT 95/63/8756 40.8  39.0 - 52.0 % Final   MCV 08/31/2023 95.6  80.0 - 100.0 fL Final   MCH 08/31/2023 31.6  26.0 - 34.0 pg Final   MCHC 08/31/2023 33.1  30.0 - 36.0 g/dL Final   RDW 43/32/9518 11.9  11.5 - 15.5 % Final   Platelets 08/31/2023 198  150 - 400 K/uL Final   nRBC 08/31/2023 0.0  0.0 - 0.2 % Final   Performed at [facility], [address], [place], [place] [address]   Sodium 08/31/2023 137  135 - 145 mmol/L Final   Potassium 08/31/2023 3.8  3.5 - 5.1 mmol/L Final   Chloride 08/31/2023 106  98 - 111 mmol/L Final   CO2 08/31/2023 25  22 - 32 mmol/L Final  Glucose, Bld 08/31/2023 131 (H)  70 - 99 mg/dL Final   Glucose reference range applies only to samples taken after fasting for at least 8 hours.   BUN 08/31/2023 14  8 - 23 mg/dL Final   Creatinine, Ser 08/31/2023 0.89  0.61 - 1.24 mg/dL Final   Calcium 09/81/1914 9.0  8.9 - 10.3 mg/dL Final   GFR, Estimated 08/31/2023 >60  >60 mL/min Final   Comment: (NOTE) Calculated using the CKD-EPI Creatinine Equation (2021)    Anion gap 08/31/2023 6  5 - 15 Final   Performed at [facility], [address], [place], [place] [address]   Glucose-Capillary 08/30/2023 225 (H)  70 - 99 mg/dL Final   Glucose reference range applies only to samples taken after fasting for at least 8 hours.   Glucose-Capillary 08/31/2023 130 (H)  70 - 99 mg/dL Final   Glucose reference range applies only to samples taken after fasting for at least 8 hours.   Glucose-Capillary 08/31/2023 152 (H)   70 - 99 mg/dL Final   Glucose reference range applies only to samples taken after fasting for at least 8 hours.  Hospital Outpatient Visit on 08/22/2023  Component Date Value Ref Range Status   MRSA, PCR 08/22/2023 NEGATIVE  NEGATIVE Final   Staphylococcus aureus 08/22/2023 POSITIVE (A)  NEGATIVE Final   Comment: (NOTE) The Xpert SA Assay (FDA approved for NASAL specimens in patients 33 years of age and older), is one component of a comprehensive surveillance program. It is not intended to diagnose infection nor to guide or monitor treatment. Performed at [facility], [address], [place], [place] [address]    Sodium 08/22/2023 138  135 - 145 mmol/L Final   Potassium 08/22/2023 4.8  3.5 - 5.1 mmol/L Final   Chloride 08/22/2023 104  98 - 111 mmol/L Final   CO2 08/22/2023 26  22 - 32 mmol/L Final   Glucose, Bld 08/22/2023 111 (H)  70 - 99 mg/dL Final   Glucose reference range applies only to samples taken after fasting for at least 8 hours.   BUN 08/22/2023 19  8 - 23 mg/dL Final   Creatinine, Ser 08/22/2023 0.96  0.61 - 1.24 mg/dL Final   Calcium 86/57/8469 9.5  8.9 - 10.3 mg/dL Final   Total Protein 62/95/2841 7.2  6.5 - 8.1 g/dL Final   Albumin 32/44/0102 4.2  3.5 - 5.0 g/dL Final   AST 72/53/6644 19  15 - 41 U/L Final   ALT 08/22/2023 17  0 - 44 U/L Final   Alkaline Phosphatase 08/22/2023 79  38 - 126 U/L Final   Total Bilirubin 08/22/2023 0.6  0.0 - 1.2 mg/dL Final   GFR, Estimated 08/22/2023 >60  >60 mL/min Final   Comment: (NOTE) Calculated using the CKD-EPI Creatinine Equation (2021)    Anion gap 08/22/2023 8  5 - 15 Final   Performed at [facility], [address], [place], [place] [address]   WBC 08/22/2023 6.4  4.0 - 10.5 K/uL Final   RBC 08/22/2023 5.03  4.22 - 5.81 MIL/uL Final   Hemoglobin 08/22/2023 15.6  13.0 - 17.0 g/dL Final   HCT 25/95/6387 49.2  39.0 - 52.0 % Final   MCV 08/22/2023 97.8  80.0 - 100.0 fL Final   MCH  08/22/2023 31.0  26.0 - 34.0 pg Final   MCHC 08/22/2023 31.7  30.0 - 36.0 g/dL Final   RDW 56/43/3295 12.1  11.5 - 15.5 % Final  Platelets 08/22/2023 206  150 - 400 K/uL Final   nRBC 08/22/2023 0.0  0.0 - 0.2 % Final   Performed at [facility], [address] [place], [place] [address]   ABO/RH(D) 08/22/2023 A POS   Final   Antibody Screen 08/22/2023 NEG   Final   Sample Expiration 08/22/2023 09/02/2023,2359   Final   Extend sample reason 08/22/2023    Final                   Value:NO TRANSFUSIONS OR PREGNANCY IN THE PAST 3 MONTHS Performed at [facility], [place], [address], [place], [place] [address]      X-Rays:DG Pelvis Portable Result Date: 08/30/2023 CLINICAL DATA:  Status post hip arthroplasty. EXAM: PORTABLE PELVIS 1-2 VIEWS COMPARISON:  None Available. FINDINGS: Right hip arthroplasty in expected alignment. No periprosthetic lucency or fracture. Recent postsurgical change includes air and edema in the soft tissues. Previous left hip arthroplasty. IMPRESSION: Right hip arthroplasty without immediate postoperative complication. Electronically Signed   By: Narda Rutherford M.D.   On: 08/30/2023 15:02   DG HIP UNILAT WITH PELVIS 1V RIGHT Result Date: 08/30/2023 CLINICAL DATA:  Elective surgery. EXAM: DG HIP (WITH OR WITHOUT PELVIS) 1V RIGHT COMPARISON:  None Available. FINDINGS: Eleven fluoroscopic spot views of the pelvis and right hip obtained in the operating room. Sequential images during hip arthroplasty. Fluoroscopy time 7 seconds. Dose 0.22 mGy. IMPRESSION: Intraoperative fluoroscopy during right hip arthroplasty. Electronically Signed   By: Narda Rutherford M.D.   On: 08/30/2023 15:02   DG C-Arm 1-60 Min-No Report Result Date: 08/30/2023 Fluoroscopy was utilized by the requesting physician.  No radiographic interpretation.   DG C-Arm 1-60 Min-No Report Result Date: 08/30/2023 Fluoroscopy was utilized by the requesting physician.  No  radiographic interpretation.    EKG: Orders placed or performed during the hospital encounter of 08/22/23   EKG 12 lead per protocol   EKG 12 lead per protocol     Hospital Course: Mike [name] is a 73 y.o. who was admitted to [facility]. They were brought to the operating room on 08/30/2023 and underwent Procedure(s): ARTHROPLASTY, HIP, TOTAL, ANTERIOR APPROACH.  Patient tolerated the procedure well and was later transferred to the recovery room and then to the orthopaedic floor for postoperative care. They were given PO and IV analgesics for pain control following their surgery. They were given 24 hours of postoperative antibiotics of  Anti-infectives (From admission, onward)    Start     Dose/Rate Route Frequency Ordered Stop   08/30/23 1800  ceFAZolin (ANCEF) IVPB 2g/100 mL premix        2 g 200 mL/hr over 30 Minutes Intravenous Every 6 hours 08/30/23 1346 08/31/23 0041   08/30/23 0815  ceFAZolin (ANCEF) IVPB 2g/100 mL premix        2 g 200 mL/hr over 30 Minutes Intravenous On call to O.R. 08/30/23 0813 08/30/23 1110     and started on DVT prophylaxis in the form of Aspirin and Xarelto.   PT and OT were ordered for total joint protocol. Discharge planning consulted to help with postop disposition and equipment needs.  Patient had a fair night on the evening of surgery. They started to get up OOB with therapy on POD #0. Pt was seen during rounds and was ready to go home pending progress with therapy. He worked with therapy on POD #1 and was meeting his goals. Pt was discharged to home later that day in  stable condition.  Diet: Regular diet Activity: WBAT Follow-up: in 2 weeks Disposition: Home Discharged Condition: stable   Discharge Instructions     Call MD / Call 911   Complete by: As directed    If you experience chest pain or shortness of breath, CALL 911 and be transported to the hospital emergency room.  If you develope a fever above 101 F, pus (white drainage)  or increased drainage or redness at the wound, or calf pain, call your surgeon's office.   Change dressing   Complete by: As directed    You have an adhesive waterproof bandage over the incision. Leave this in place until your first follow-up appointment. Once you remove this you will not need to place another bandage.   Constipation Prevention   Complete by: As directed    Drink plenty of fluids.  Prune juice may be helpful.  You may use a stool softener, such as Colace (over the counter) 100 mg twice a day.  Use MiraLax (over the counter) for constipation as needed.   Diet - low sodium heart healthy   Complete by: As directed    Do not sit on low chairs, stoools or toilet seats, as it may be difficult to get up from low surfaces   Complete by: As directed    Driving restrictions   Complete by: As directed    No driving for two weeks   Post-operative opioid taper instructions:   Complete by: As directed    POST-OPERATIVE OPIOID TAPER INSTRUCTIONS: It is important to wean off of your opioid medication as soon as possible. If you do not need pain medication after your surgery it is ok to stop day one. Opioids include: Codeine, Hydrocodone(Norco, Vicodin), Oxycodone(Percocet, oxycontin) and hydromorphone amongst others.  Long term and even short term use of opiods can cause: Increased pain response Dependence Constipation Depression Respiratory depression And more.  Withdrawal symptoms can include Flu like symptoms Nausea, vomiting And more Techniques to manage these symptoms Hydrate well Eat regular healthy meals Stay active Use relaxation techniques(deep breathing, meditating, yoga) Do Not substitute Alcohol to help with tapering If you have been on opioids for less than two weeks and do not have pain than it is ok to stop all together.  Plan to wean off of opioids This plan should start within one week post op of your joint replacement. Maintain the same interval or time  between taking each dose and first decrease the dose.  Cut the total daily intake of opioids by one tablet each day Next start to increase the time between doses. The last dose that should be eliminated is the evening dose.      TED hose   Complete by: As directed    Use stockings (TED hose) for three weeks on both leg(s).  You may remove them at night for sleeping.   Weight bearing as tolerated   Complete by: As directed       Allergies as of 08/31/2023       Reactions   Ace Inhibitors Cough   Beta Adrenergic Blockers Rash        Medication List     TAKE these medications    aspirin EC 81 MG tablet Take 81 mg by mouth daily. Swallow whole.   B-complex with vitamin C tablet Take 1 tablet by mouth in the morning.   chlorhexidine 4 % external liquid Commonly known as: HIBICLENS Apply 15 mLs (1 Application total) topically as directed for  30 doses. Use as directed daily for 5 days every other week for 6 weeks.   Cholecalciferol 25 MCG (1000 UT) tablet Take 1,000 Units by mouth in the morning.   clonazePAM 1 MG tablet Commonly known as: KLONOPIN Take 1 mg by mouth 2 (two) times daily.   dofetilide 250 MCG capsule Commonly known as: TIKOSYN Take 250 mcg by mouth every 12 (twelve) hours.   furosemide 40 MG tablet Commonly known as: LASIX Take 1 tablet by mouth daily as needed.   gabapentin 400 MG capsule Commonly known as: NEURONTIN Take 400 mg by mouth 3 (three) times daily.   HYDROmorphone 2 MG tablet Commonly known as: DILAUDID Take 1-2 tablets (2-4 mg total) by mouth every 8 (eight) hours as needed for severe pain (pain score 7-10) (breakthrough pain not controlled by chronic oxycodone).   meclizine 25 MG tablet Commonly known as: ANTIVERT Take 25 mg by mouth 3 (three) times daily as needed for dizziness.   metFORMIN 500 MG tablet Commonly known as: GLUCOPHAGE Take 500 mg by mouth in the morning and at bedtime.   methocarbamol 500 MG tablet Commonly  known as: ROBAXIN Take 1 tablet (500 mg total) by mouth every 6 (six) hours as needed for muscle spasms.   metoprolol tartrate 25 MG tablet Commonly known as: LOPRESSOR Take 25 mg by mouth daily.   multivitamin with minerals Tabs tablet Take 1 tablet by mouth in the morning.   mupirocin ointment 2 % Commonly known as: BACTROBAN Place 1 Application into the nose 2 (two) times daily for 60 doses. Use as directed 2 times daily for 5 days every other week for 6 weeks.   ondansetron 4 MG tablet Commonly known as: ZOFRAN Take 1 tablet (4 mg total) by mouth every 6 (six) hours as needed for nausea.   Oxycodone HCl 10 MG Tabs Take 1 tablet by mouth 3 (three) times daily as needed (pain).   potassium chloride 10 MEQ tablet Commonly known as: KLOR-CON M Take 10 mEq by mouth daily.   pravastatin 20 MG tablet Commonly known as: PRAVACHOL Take 20 mg by mouth at bedtime.   rivaroxaban 20 MG Tabs tablet Commonly known as: XARELTO Take 20 mg by mouth in the morning.   SV Iron 325 (65 Fe) MG tablet Generic drug: ferrous sulfate Take 1 tablet by mouth daily.   traMADol 50 MG tablet Commonly known as: ULTRAM Take 1-2 tablets (50-100 mg total) by mouth every 6 (six) hours as needed for moderate pain (pain score 4-6).   vitamin C 1000 MG tablet Take 1,000 mg by mouth in the morning.   Voltaren 1 % Gel Generic drug: diclofenac Sodium Apply 2 g topically daily as needed (arthritis).   Zinc 30 MG Tabs Take 30 mg by mouth in the morning.               Discharge Care Instructions  (From admission, onward)           Start     Ordered   08/31/23 0000  Weight bearing as tolerated        08/31/23 0731   08/31/23 0000  Change dressing       Comments: You have an adhesive waterproof bandage over the incision. Leave this in place until your first follow-up appointment. Once you remove this you will not need to place another bandage.   08/31/23 0731            Follow-up  Information  Ollen Gross, MD. Schedule an appointment as soon as possible for a visit in 2 week(s).   Specialty: Orthopedic Surgery Contact information: [address] [place] [address] [place] [address] [phone]                 Signed: R. Arcola Jansky, PA-C Orthopedic Surgery 09/04/2023, 9:18 AM

## 2024-01-24 ENCOUNTER — Other Ambulatory Visit: Payer: Self-pay | Admitting: Orthopedic Surgery
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
## 2024-02-20 NOTE — Progress Notes (Signed)
 [facility] Telephonic Visit NON VIDEO VISIT   Patient ID:  Mike [name] is a 73 y.o. (DOB 02/22/1951) male  Has been on xarelto  for 12 yrs+, w/o any issues except for cost, particularly [place].   Has had some brusing that ultimately heals but can take a while to heal.   Upcoming surgery on the 18th for his ankle  which is very painful, pt has received instructions to hold Xarelto  for 2 days prior to procedure, and will ask when is it safe to restart after procedure.   Patient has been advised as to the limitations and limited nature of physical exam due to nature of a telephone visit, the possibility of privacy risk in the use of a telephone visit, and that the healthcare provider may recommend visiting a healthcare clinic for in-person care and follow up.  Telephonic Visit Assessment and Plan   1. Paroxysmal atrial fibrillation (*) (Primary)  - Continue Xarelto  20mg  daily.   - Continue monitoring BMP/CBC q 6 months   Indication for anticoagulation:  1. Paroxysmal atrial fibrillation (*)     Anticoagulant and dosing regimen: Xarelto  20mg  DAILY for Afib. Crcl >50  Follow-up assessment: Any new relevant medication problems,  ED visits/hospitalizations? no  Any new embolic events? no  Any issues with taking DOAC properly? no  Any s/sx of bleeding? no  EtOh overuse? no  Drug interactions present? Asa 81mg  - managed by card.  Has patient had CBC and CMP in last 6 months? CBC, CMP  is stable/ WNL (12/2023 - [facility])  Lab Results  Component Value Date   eGFR 77 07/13/2022   ALT (SGPT) 11 07/13/2022   AST 14 07/13/2022   Alkaline Phosphatase 128 (H) 07/13/2022   Hemoglobin 15.1 03/06/2023   Hematocrit 45.7 03/06/2023   Platelet Count 230 03/06/2023    CrCl cannot be calculated (Patient's most recent lab result is older than the maximum 90 days allowed.).  Rivaroxaban  Review CrCl Using Actual Weight Input CrCl (actual wt): 85.76 mL/min  Adherence  concerns Any skipped/missed doses of medications? no  Is it difficult to pay for medications? YES  Education Provided: Indication for anticoagulation Importance of adherence to medication regimen Signs and symptoms of bleeding for which to monitor Signs and symptoms of blood clot for which to monitor When to seek immediate medical attention Importance of telling your doctors and dentists about all the medications you are taking including those that do not require a prescription (ie. aspirin , NSAIDS, supplements) If warfarin: reviewed foods/supplements/common drug classes that may increase or decrease INR and alter warfarin's action If LMWH: reviewed injection technique to include choosing appropriate site for injection, preparation of site, administration of medication, disposal of syringe If DOAC:  pertinent agent specific administration details discussed Pt was given opportunity to ask questions to clarify education and was able to demonstrate understanding of topics discussed.   Patient agreeable to transition to [facility] for quality, safety, adherence, and cost savings measures.  Will involve pharmacy team to assist with transition. Patient was made aware of their right to have prescriptions sent to a pharmacy of their choosing that best meets their needs.     Patient's Medications       * Accurate as of February 20, 2024  8:07 AM. Reflects encounter med changes as of last refresh          Continued Medications      Instructions  aspirin  EC tablet Commonly known as:  ECOTRIN LOW DOSE  81 mg, Oral, Every morning   clonazePAM  1 mg tablet Commonly known as: KLONOPIN   1 mg, Oral, 2 times a day as needed   dofetilide  250 mcg capsule Commonly known as: TIKOSYN   250 mcg, Oral, Every 12 hours   furosemide 40 mg tablet Commonly known as: LASIX  40 mg, Oral, Daily as needed   gabapentin  400 mg capsule Commonly known as: NEURONTIN   400 mg,  Oral, 3 times daily   meclizine  HCl 25 mg tablet Commonly known as: ANTIVERT   25 mg, Oral, 3 times a day as needed   metFORMIN 500 mg tablet Commonly known as: GLUCOPHAGE  500 mg, Oral, 2 times daily with meals   metoprolol  tartrate 25 mg tablet Commonly known as: LOPRESSOR   25 mg, Oral, 2 times a day   MULTI-VITAMIN tablet  1 tablet, Oral, Every morning   potassium chloride  10 mEq crys ER tablet Commonly known as: KLOR-CON  M10  10 mEq, Oral, Daily   pravastatin  sodium 20 mg tablet Commonly known as: PRAVACHOL   20 mg, Oral, At bedtime   SV IRON 325 (65 Fe) MG tablet Generic drug: ferrous sulfate  325 mg, Oral, Daily   vitamin C 1000 MG tablet  1,000 mg, Oral, Every morning   VITAMIN D-1000 MAX ST 25 MCG (1000 UT) tablet Generic drug: cholecalciferol  1,000 Units, Oral, Every morning   VOLTAREN ARTHRITIS PAIN 1 % gel Generic drug: diclofenac sodium  Topical, 4 times a day as needed   XARELTO  20 MG Tabs tablet Generic drug: rivaroxaban   20 mg, Oral, Daily   Zinc 30 MG Tabs  30 mg, Oral, Every morning        Risk, benefits, and alternatives were provided through patient instructions given to the patient during the telephone interaction.  If any worsening symptoms or lack of improvement, the patient will seek immediate medical care.  Telephonic Visit History      Patient presents with  . Anticoagulation        Reviewed and updated this visit by provider: None        ROS:  As documented in the history above, all other relevant system complaints were negative.  Telephonic Visit Objective Findings   This is a telephone visit. Examination conducted without the use of video cameras/computer monitors. Vital signs and other aspects of physical exam are limited due to the nature of this encounter.   Constitutional:  No apparent acute distress noted during the telephone interaction; Alert and verbally interactive. Mood:  Appears appropriate to  situation.  I have spent the following amount of time in direct patient care for this encounter and in coordination of care: Established Patient - 30 minutes     *Some images could not be shown.

## 2024-02-22 NOTE — Progress Notes (Signed)
 Cardiac clearance sent over by Dr. Leodis office will need to be clarified for xarelto  orders.  Need exact hold time, additionally pacemaker form has been faxed over and requested to be filled out prior to surgery planned on 02/29/2024.

## 2024-02-23 ENCOUNTER — Encounter (HOSPITAL_BASED_OUTPATIENT_CLINIC_OR_DEPARTMENT_OTHER): Payer: Self-pay | Admitting: Orthopedic Surgery

## 2024-02-23 ENCOUNTER — Other Ambulatory Visit: Payer: Self-pay

## 2024-02-23 NOTE — Progress Notes (Signed)
   02/23/24 1601  PAT Phone Screen  Is the patient taking a GLP-1 receptor agonist? No  Do You Have Diabetes? Yes  Do You Have Hypertension? Yes  Have You Ever Been to the ER for Asthma? No  Have You Taken Oral Steroids in the Past 3 Months? No  Do you Take Phenteramine or any Other Diet Drugs? No  Recent  Lab Work, EKG, CXR? No  Do you have a history of heart problems? (S)  Yes (CAD, a-fib, pacemaker, tachy/brady syndrome)  Cardiologist Name Dr Meldon at [facility]  Have you ever had tests on your heart? Yes  What cardiac tests were performed? Echo;Stress Test  What date/year were cardiac tests completed? 09-01-22 ECHO EF 55-60%, 08-04-20 stress test-no ischemia  Results viewable: Care Everywhere  Any Recent Hospitalizations? No  Height 6' 2 (1.88 m)  Weight 89.8 kg  Pat Appointment Scheduled (S)  Yes (BMP, EKG, PCR)

## 2024-02-26 ENCOUNTER — Encounter (HOSPITAL_BASED_OUTPATIENT_CLINIC_OR_DEPARTMENT_OTHER)
Admission: RE | Admit: 2024-02-26 | Discharge: 2024-02-26 | Disposition: A | Discharge status: Home / Self Care | Source: Ambulatory Visit | Attending: Orthopedic Surgery | Admitting: Orthopedic Surgery

## 2024-02-26 DIAGNOSIS — E119 Type 2 diabetes mellitus without complications: Secondary | ICD-10-CM | POA: Diagnosis not present

## 2024-02-26 DIAGNOSIS — Z01812 Encounter for preprocedural laboratory examination: Secondary | ICD-10-CM | POA: Insufficient documentation

## 2024-02-26 DIAGNOSIS — I10 Essential (primary) hypertension: Secondary | ICD-10-CM | POA: Insufficient documentation

## 2024-02-26 DIAGNOSIS — Z01818 Encounter for other preprocedural examination: Secondary | ICD-10-CM | POA: Diagnosis present

## 2024-02-26 LAB — BASIC METABOLIC PANEL WITH GFR
Anion gap: 9 (ref 5–15)
BUN: 12 mg/dL (ref 8–23)
CO2: 25 mmol/L (ref 22–32)
Calcium: 9.3 mg/dL (ref 8.9–10.3)
Chloride: 105 mmol/L (ref 98–111)
Creatinine, Ser: 1.1 mg/dL (ref 0.61–1.24)
GFR, Estimated: >60 mL/min (ref >60)
Glucose, Bld: 103 mg/dL — ABNORMAL HIGH (ref 70–99)
Potassium: 5 mmol/L (ref 3.5–5.1)
Sodium: 139 mmol/L (ref 135–145)

## 2024-02-26 LAB — SURGICAL PCR SCREEN
MRSA, PCR: NEGATIVE
Staphylococcus aureus: POSITIVE — AB

## 2024-02-26 NOTE — Progress Notes (Signed)

## 2024-02-26 NOTE — Progress Notes (Signed)
 Left message to follow up on pacemaker and xarelto  orders for planned surgery with Dr. Kit for 02/29/2024. Still haven't received those orders as of yet.

## 2024-02-27 NOTE — Telephone Encounter (Signed)
 Faxed device clearance form to Megan.

## 2024-02-28 NOTE — Anesthesia Preprocedure Evaluation (Signed)
 Anesthesia Evaluation  Patient identified by MRN, date of birth, ID band Patient awake    Reviewed: Allergy & Precautions, NPO status , Patient's Chart, lab work & pertinent test results  History of Anesthesia Complications Negative for: history of anesthetic complications  Airway Mallampati: II  TM Distance: >3 FB Neck ROM: Full    Dental  (+) Poor Dentition, Dental Advisory Given, Chipped   Pulmonary neg shortness of breath, neg sleep apnea, neg COPD, neg recent URI, former smoker   Pulmonary exam normal breath sounds clear to auscultation       Cardiovascular hypertension (metoprolol ), Pt. on home beta blockers (-) angina + CAD and +CHF  (-) Past MI, (-) Cardiac Stents and (-) CABG + dysrhythmias (prolonged QT) Atrial Fibrillation + pacemaker  Rhythm:Regular Rate:Normal  HLD  TTE 09/01/2022: Impression  1.  Left ventricle: The cavity size is normal. There is mild concentric hypertrophy. Systolic function is normal. The estimated ejection fraction is 55-60%. Wall motion is normal; there are no regional wall motion abnormalities. Normal diastolic function. 2.  Right ventricle: The cavity size is normal. Systolic function is normal. 3.  Tricuspid valve: There is mild regurgitation. 4.  Systemic arteries: The ascending aorta diameter is 4.0 cm. 5.  Pericardium, extracardiac: There is no pericardial effusion.    Neuro/Psych neg Seizures PSYCHIATRIC DISORDERS Anxiety     TIA (patient unaware of when this occurred)   GI/Hepatic negative GI ROS, Neg liver ROS,,,  Endo/Other  diabetes, Type 2, Oral Hypoglycemic Agents    Renal/GU negative Renal ROS     Musculoskeletal  (+) Arthritis ,    Abdominal   Peds  Hematology negative hematology ROS (+)   Anesthesia Other Findings Last Xarelto : 02/27/2024  Reproductive/Obstetrics                              Anesthesia Physical Anesthesia Plan  ASA:  3  Anesthesia Plan: General   Post-op Pain Management: Regional block* and Tylenol  PO (pre-op)*   Induction: Intravenous  PONV Risk Score and Plan: 2 and Ondansetron , Dexamethasone  and Treatment may vary due to age or medical condition  Airway Management Planned: LMA  Additional Equipment:   Intra-op Plan:   Post-operative Plan: Extubation in OR  Informed Consent: I have reviewed the patients History and Physical, chart, labs and discussed the procedure including the risks, benefits and alternatives for the proposed anesthesia with the patient or authorized representative who has indicated his/her understanding and acceptance.     Dental advisory given  Plan Discussed with: CRNA and Anesthesiologist  Anesthesia Plan Comments: (Discussed potential risks of nerve blocks including, but not limited to, infection, bleeding, nerve damage, seizures, pneumothorax, respiratory depression, and potential failure of the block. Alternatives to nerve blocks discussed. All questions answered.  Risks of general anesthesia discussed including, but not limited to, sore throat, hoarse voice, chipped/damaged teeth, injury to vocal cords, nausea and vomiting, allergic reactions, lung infection, heart attack, stroke, and death. All questions answered. )         Anesthesia Quick Evaluation

## 2024-02-29 ENCOUNTER — Encounter (HOSPITAL_BASED_OUTPATIENT_CLINIC_OR_DEPARTMENT_OTHER)
Admission: RE | Disposition: A | Discharge status: Home / Self Care | Payer: Self-pay | Source: Home / Self Care | Attending: Orthopedic Surgery

## 2024-02-29 ENCOUNTER — Ambulatory Visit (HOSPITAL_BASED_OUTPATIENT_CLINIC_OR_DEPARTMENT_OTHER): Payer: Self-pay | Admitting: Anesthesiology

## 2024-02-29 ENCOUNTER — Other Ambulatory Visit: Payer: Self-pay

## 2024-02-29 ENCOUNTER — Ambulatory Visit (HOSPITAL_BASED_OUTPATIENT_CLINIC_OR_DEPARTMENT_OTHER)
Admission: RE | Admit: 2024-02-29 | Discharge: 2024-02-29 | Disposition: A | Discharge status: Home / Self Care | Attending: Orthopedic Surgery | Admitting: Orthopedic Surgery

## 2024-02-29 ENCOUNTER — Ambulatory Visit (HOSPITAL_COMMUNITY)

## 2024-02-29 ENCOUNTER — Encounter (HOSPITAL_BASED_OUTPATIENT_CLINIC_OR_DEPARTMENT_OTHER): Payer: Self-pay | Admitting: Orthopedic Surgery

## 2024-02-29 DIAGNOSIS — I110 Hypertensive heart disease with heart failure: Secondary | ICD-10-CM | POA: Diagnosis not present

## 2024-02-29 DIAGNOSIS — M6701 Short Achilles tendon (acquired), right ankle: Secondary | ICD-10-CM | POA: Insufficient documentation

## 2024-02-29 DIAGNOSIS — Z87891 Personal history of nicotine dependence: Secondary | ICD-10-CM | POA: Insufficient documentation

## 2024-02-29 DIAGNOSIS — Z7984 Long term (current) use of oral hypoglycemic drugs: Secondary | ICD-10-CM | POA: Insufficient documentation

## 2024-02-29 DIAGNOSIS — I509 Heart failure, unspecified: Secondary | ICD-10-CM | POA: Diagnosis not present

## 2024-02-29 DIAGNOSIS — I4891 Unspecified atrial fibrillation: Secondary | ICD-10-CM | POA: Insufficient documentation

## 2024-02-29 DIAGNOSIS — M19071 Primary osteoarthritis, right ankle and foot: Secondary | ICD-10-CM | POA: Insufficient documentation

## 2024-02-29 DIAGNOSIS — F419 Anxiety disorder, unspecified: Secondary | ICD-10-CM | POA: Diagnosis not present

## 2024-02-29 DIAGNOSIS — E785 Hyperlipidemia, unspecified: Secondary | ICD-10-CM | POA: Insufficient documentation

## 2024-02-29 DIAGNOSIS — E119 Type 2 diabetes mellitus without complications: Secondary | ICD-10-CM | POA: Diagnosis not present

## 2024-02-29 DIAGNOSIS — I2510 Atherosclerotic heart disease of native coronary artery without angina pectoris: Secondary | ICD-10-CM

## 2024-02-29 DIAGNOSIS — I10 Essential (primary) hypertension: Secondary | ICD-10-CM

## 2024-02-29 DIAGNOSIS — Z8673 Personal history of transient ischemic attack (TIA), and cerebral infarction without residual deficits: Secondary | ICD-10-CM | POA: Insufficient documentation

## 2024-02-29 DIAGNOSIS — Z01818 Encounter for other preprocedural examination: Secondary | ICD-10-CM

## 2024-02-29 DIAGNOSIS — M25771 Osteophyte, right ankle: Secondary | ICD-10-CM | POA: Insufficient documentation

## 2024-02-29 HISTORY — PX: TOTAL ANKLE ARTHROPLASTY: SHX811

## 2024-02-29 LAB — GLUCOSE, CAPILLARY
Glucose-Capillary: 102 mg/dL — ABNORMAL HIGH (ref 70–99)
Glucose-Capillary: 87 mg/dL (ref 70–99)

## 2024-02-29 SURGERY — ARTHROPLASTY, ANKLE, TOTAL
Anesthesia: General | Site: Ankle | Laterality: Right

## 2024-02-29 MED ORDER — VANCOMYCIN HCL 500 MG IV SOLR
INTRAVENOUS | Status: DC | PRN
  Administered 2024-02-29: 500 mg via TOPICAL

## 2024-02-29 MED ORDER — 0.9 % SODIUM CHLORIDE (POUR BTL) OPTIME
TOPICAL | Status: DC | PRN
  Administered 2024-02-29: 1000 mL

## 2024-02-29 MED ORDER — ONDANSETRON HCL 4 MG/2ML IJ SOLN
INTRAMUSCULAR | Status: AC
Start: 2024-02-29 — End: 2024-02-29
  Filled 2024-02-29: qty 6

## 2024-02-29 MED ORDER — PHENYLEPHRINE HCL (PRESSORS) 10 MG/ML IV SOLN
INTRAVENOUS | Status: DC | PRN
  Administered 2024-02-29 (×2): 80 ug via INTRAVENOUS
  Administered 2024-02-29 (×2): 160 ug via INTRAVENOUS
  Administered 2024-02-29: 80 ug via INTRAVENOUS

## 2024-02-29 MED ORDER — PHENYLEPHRINE HCL-NACL 20-0.9 MG/250ML-% IV SOLN
INTRAVENOUS | Status: DC | PRN
  Administered 2024-02-29: 40 ug/min via INTRAVENOUS

## 2024-02-29 MED ORDER — BUPIVACAINE LIPOSOME 1.3 % IJ SUSP
INTRAMUSCULAR | Status: DC | PRN
  Administered 2024-02-29: 10 mL via PERINEURAL

## 2024-02-29 MED ORDER — FENTANYL CITRATE (PF) 100 MCG/2ML IJ SOLN
INTRAMUSCULAR | Status: DC | PRN
  Administered 2024-02-29 (×2): 50 ug via INTRAVENOUS

## 2024-02-29 MED ORDER — ACETAMINOPHEN 500 MG PO TABS
ORAL_TABLET | ORAL | Status: AC
Start: 2024-02-29 — End: 2024-02-29
  Filled 2024-02-29: qty 2

## 2024-02-29 MED ORDER — CEFAZOLIN SODIUM-DEXTROSE 2-4 GM/100ML-% IV SOLN
2.0000 g | INTRAVENOUS | Status: AC
  Administered 2024-02-29: 2 g via INTRAVENOUS

## 2024-02-29 MED ORDER — LACTATED RINGERS IV SOLN: INTRAVENOUS | Status: DC

## 2024-02-29 MED ORDER — AMISULPRIDE (ANTIEMETIC) 5 MG/2ML IV SOLN: 10.0000 mg | Freq: Once | INTRAVENOUS | Status: DC | PRN

## 2024-02-29 MED ORDER — BUPIVACAINE HCL (PF) 0.5 % IJ SOLN
INTRAMUSCULAR | Status: DC | PRN
  Administered 2024-02-29: 10 mL via PERINEURAL

## 2024-02-29 MED ORDER — BUPIVACAINE HCL (PF) 0.25 % IJ SOLN
INTRAMUSCULAR | Status: DC | PRN
  Administered 2024-02-29 (×2): 20 mL via PERINEURAL

## 2024-02-29 MED ORDER — ACETAMINOPHEN 500 MG PO TABS
1000.0000 mg | ORAL_TABLET | Freq: Once | ORAL | Status: AC
  Administered 2024-02-29: 1000 mg via ORAL

## 2024-02-29 MED ORDER — FENTANYL CITRATE (PF) 100 MCG/2ML IJ SOLN
INTRAMUSCULAR | Status: AC
  Filled 2024-02-29: qty 2

## 2024-02-29 MED ORDER — FENTANYL CITRATE (PF) 100 MCG/2ML IJ SOLN
25.0000 ug | INTRAMUSCULAR | Status: DC | PRN
  Administered 2024-02-29 (×2): 50 ug via INTRAVENOUS

## 2024-02-29 MED ORDER — DOCUSATE SODIUM 100 MG PO CAPS
100.0000 mg | ORAL_CAPSULE | Freq: Two times a day (BID) | ORAL | 0 refills | Status: AC
Start: 2024-02-29 — End: ?

## 2024-02-29 MED ORDER — LIDOCAINE 2% (20 MG/ML) 5 ML SYRINGE
INTRAMUSCULAR | Status: AC
  Filled 2024-02-29: qty 20

## 2024-02-29 MED ORDER — PROPOFOL 500 MG/50ML IV EMUL
INTRAVENOUS | Status: AC
  Filled 2024-02-29: qty 100

## 2024-02-29 MED ORDER — OXYCODONE HCL 5 MG/5ML PO SOLN: 5.0000 mg | Freq: Once | ORAL | Status: DC | PRN

## 2024-02-29 MED ORDER — CEFAZOLIN SODIUM-DEXTROSE 2-4 GM/100ML-% IV SOLN
INTRAVENOUS | Status: AC
  Filled 2024-02-29: qty 100

## 2024-02-29 MED ORDER — SENNA 8.6 MG PO TABS: 2.0000 | ORAL_TABLET | Freq: Two times a day (BID) | ORAL | 0 refills | Status: AC

## 2024-02-29 MED ORDER — DEXAMETHASONE SODIUM PHOSPHATE 4 MG/ML IJ SOLN
INTRAMUSCULAR | Status: DC | PRN
  Administered 2024-02-29: 5 mg via INTRAVENOUS

## 2024-02-29 MED ORDER — OXYCODONE HCL 5 MG PO TABS: 5.0000 mg | ORAL_TABLET | Freq: Four times a day (QID) | ORAL | 0 refills | Status: AC | PRN

## 2024-02-29 MED ORDER — PROPOFOL 10 MG/ML IV BOLUS
INTRAVENOUS | Status: DC | PRN
  Administered 2024-02-29: 150 mg via INTRAVENOUS

## 2024-02-29 MED ORDER — MIDAZOLAM HCL 2 MG/2ML IJ SOLN
INTRAMUSCULAR | Status: AC
  Filled 2024-02-29: qty 2

## 2024-02-29 MED ORDER — OXYCODONE HCL 5 MG PO TABS: 5.0000 mg | ORAL_TABLET | Freq: Once | ORAL | Status: DC | PRN

## 2024-02-29 MED ORDER — DEXAMETHASONE SODIUM PHOSPHATE 10 MG/ML IJ SOLN
INTRAMUSCULAR | Status: AC
  Filled 2024-02-29: qty 5

## 2024-02-29 MED ORDER — FENTANYL CITRATE (PF) 100 MCG/2ML IJ SOLN
100.0000 ug | Freq: Once | INTRAMUSCULAR | Status: AC
  Administered 2024-02-29: 100 ug via INTRAVENOUS

## 2024-02-29 MED ORDER — ONDANSETRON HCL 4 MG/2ML IJ SOLN
INTRAMUSCULAR | Status: DC | PRN
  Administered 2024-02-29: 4 mg via INTRAVENOUS

## 2024-02-29 MED ORDER — LIDOCAINE HCL (CARDIAC) PF 100 MG/5ML IV SOSY
PREFILLED_SYRINGE | INTRAVENOUS | Status: DC | PRN
  Administered 2024-02-29: 100 mg via INTRAVENOUS

## 2024-02-29 MED ORDER — SURGIPHOR WOUND IRRIGATION SYSTEM - OPTIME
TOPICAL | Status: DC | PRN
  Administered 2024-02-29: 450 mL via TOPICAL

## 2024-02-29 MED ORDER — DEXMEDETOMIDINE HCL IN NACL 80 MCG/20ML IV SOLN
INTRAVENOUS | Status: AC
  Filled 2024-02-29: qty 20

## 2024-02-29 MED ORDER — PHENYLEPHRINE 80 MCG/ML (10ML) SYRINGE FOR IV PUSH (FOR BLOOD PRESSURE SUPPORT)
PREFILLED_SYRINGE | INTRAVENOUS | Status: AC
  Filled 2024-02-29: qty 20

## 2024-02-29 SURGICAL SUPPLY — 77 items
BLADE RECIPRO TAPERED (BLADE) ×2 IMPLANT
BLADE SAW OSC ANKLE 8X63X1.19 (BLADE) IMPLANT
BLADE SAW RECIP ANKLE 8X50X1 (PIN) IMPLANT
BLADE SURG 15 STRL LF DISP TIS (BLADE) ×6 IMPLANT
BNDG COMPR ESMARK 6X3 LF (GAUZE/BANDAGES/DRESSINGS) IMPLANT
BNDG ELASTIC 4INX 5YD STR LF (GAUZE/BANDAGES/DRESSINGS) ×2 IMPLANT
CHLORAPREP W/TINT 26 (MISCELLANEOUS) ×3 IMPLANT
CLIP LOCK TOTAL ANKLE SZ4 (Orthopedic Implant) ×1 IMPLANT
COVER BACK TABLE 60X90IN (DRAPES) ×4 IMPLANT
COVER MAYO STAND STRL (DRAPES) ×2 IMPLANT
CUFF TRNQT CYL 34X4.125X (TOURNIQUET CUFF) IMPLANT
DRAPE C-ARM 42X72 X-RAY (DRAPES) ×2 IMPLANT
DRAPE C-ARMOR (DRAPES) ×2 IMPLANT
DRAPE EXTREMITY T 121X128X90 (DISPOSABLE) ×2 IMPLANT
DRAPE IMP U-DRAPE 54X76 (DRAPES) ×2 IMPLANT
DRAPE U-SHAPE 47X51 STRL (DRAPES) ×2 IMPLANT
DRESSING MEPILEX FLEX 4X4 (GAUZE/BANDAGES/DRESSINGS) ×2 IMPLANT
DRSG AQUACEL AG ADV 3.5X10 (GAUZE/BANDAGES/DRESSINGS) ×2 IMPLANT
DRSG MEPITEL 4X7.2 (GAUZE/BANDAGES/DRESSINGS) ×1 IMPLANT
DURAPREP 26ML APPLICATOR (WOUND CARE) ×2 IMPLANT
ELECTRODE REM PT RTRN 9FT ADLT (ELECTROSURGICAL) ×2 IMPLANT
GAUZE PAD ABD 8X10 STRL (GAUZE/BANDAGES/DRESSINGS) ×4 IMPLANT
GAUZE SPONGE 4X4 12PLY STRL (GAUZE/BANDAGES/DRESSINGS) ×2 IMPLANT
GLOVE BIO SURGEON STRL SZ8 (GLOVE) ×2 IMPLANT
GLOVE BIOGEL PI IND STRL 7.0 (GLOVE) ×1 IMPLANT
GLOVE BIOGEL PI IND STRL 7.5 (GLOVE) ×1 IMPLANT
GLOVE BIOGEL PI IND STRL 8 (GLOVE) ×5 IMPLANT
GLOVE ECLIPSE 8.0 STRL XLNG CF (GLOVE) ×3 IMPLANT
GLOVE SURG SS PI 7.0 STRL IVOR (GLOVE) ×1 IMPLANT
GOWN STRL REUS W/ TWL LRG LVL3 (GOWN DISPOSABLE) ×2 IMPLANT
GOWN STRL REUS W/ TWL XL LVL3 (GOWN DISPOSABLE) ×5 IMPLANT
GOWN STRL REUS W/TWL XL LVL3 (GOWN DISPOSABLE) ×2 IMPLANT
IMPL TOTAL ANKLE TALAR SZ3 (Orthopedic Implant) ×1 IMPLANT
INSERT TIB 8 SZ 3 FB RT (Insert) ×1 IMPLANT
NDL HYPO 22X1.5 SAFETY MO (MISCELLANEOUS) IMPLANT
NDL HYPO 25X1 1.5 SAFETY (NEEDLE) IMPLANT
NEEDLE HYPO 22X1.5 SAFETY MO (MISCELLANEOUS) IMPLANT
NEEDLE HYPO 25X1 1.5 SAFETY (NEEDLE) IMPLANT
NS IRRIG 1000ML POUR BTL (IV SOLUTION) ×2 IMPLANT
PACK BASIN DAY SURGERY FS (CUSTOM PROCEDURE TRAY) ×2 IMPLANT
PAD CAST 4YDX4 CTTN HI CHSV (CAST SUPPLIES) ×4 IMPLANT
PADDING CAST ABS COTTON 4X4 ST (CAST SUPPLIES) IMPLANT
PADDING CAST COTTON 6X4 STRL (CAST SUPPLIES) ×2 IMPLANT
PENCIL SMOKE EVACUATOR (MISCELLANEOUS) ×2 IMPLANT
PIN POUCH TALAR VANTAGE 2.0 (PIN) ×1 IMPLANT
PIN POUCH TALAR VANTAGE 2.5 (PIN) ×1 IMPLANT
PIN POUCH TALAR VANTAGE 3.5 (PIN) ×2 IMPLANT
PLATE VANTAGE TOTAL ANKLE SZ4 (Plate) ×1 IMPLANT
POUCH SCREW TALAR ANKLE (ORTHOPEDIC DISPOSABLE SUPPLIES) ×1 IMPLANT
SANITIZER HAND ALTRA PUMP 550 (MISCELLANEOUS) ×2 IMPLANT
SAW STRYKER ANKLE 10X75X1.19 (BLADE) ×1 IMPLANT
SCOTCHCAST PLUS 3X4 WHITE (CAST SUPPLIES) IMPLANT
SCOTCHCAST PLUS 4X4 WHITE (CAST SUPPLIES) ×3 IMPLANT
SHEET MEDIUM DRAPE 40X70 STRL (DRAPES) ×2 IMPLANT
SLEEVE SCD COMPRESS KNEE MED (STOCKING) ×2 IMPLANT
SPIKE FLUID TRANSFER (MISCELLANEOUS) IMPLANT
SPONGE T-LAP 18X18 ~~LOC~~+RFID (SPONGE) ×2 IMPLANT
STOCKINETTE 6 STRL (DRAPES) ×2 IMPLANT
STRIP CLOSURE SKIN 1/2X4 (GAUZE/BANDAGES/DRESSINGS) ×2 IMPLANT
SUCTION TUBE FRAZIER 10FR DISP (SUCTIONS) IMPLANT
SURGILUBE 2OZ TUBE FLIPTOP (MISCELLANEOUS) ×2 IMPLANT
SUT ETHILON 3 0 PS 1 (SUTURE) IMPLANT
SUT MNCRL AB 3-0 PS2 18 (SUTURE) ×2 IMPLANT
SUT STRATA PDS 3-0 PS-1 (SUTURE) ×2 IMPLANT
SUT VIC AB 0 CT1 27XBRD ANBCTR (SUTURE) ×2 IMPLANT
SUT VIC AB 2-0 SH 27XBRD (SUTURE) IMPLANT
SUT VICRYL 0 SH 27 (SUTURE) IMPLANT
SUT VICRYL 0 UR6 27IN ABS (SUTURE) IMPLANT
SYR 20ML LL LF (SYRINGE) IMPLANT
SYR BULB EAR ULCER 3OZ GRN STR (SYRINGE) IMPLANT
SYR BULB IRRIG 60ML STRL (SYRINGE) ×2 IMPLANT
SYR CONTROL 10ML LL (SYRINGE) IMPLANT
TOWEL GREEN STERILE FF (TOWEL DISPOSABLE) ×4 IMPLANT
TUBE CONNECTING 20X1/4 (TUBING) ×2 IMPLANT
Total Ankle Activit-E Tibial Insert, Fixed Bearing ×2 IMPLANT
UNDERPAD 30X36 HEAVY ABSORB (UNDERPADS AND DIAPERS) ×2 IMPLANT
YANKAUER SUCT BULB TIP NO VENT (SUCTIONS) ×2 IMPLANT
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
## 2024-02-29 NOTE — Anesthesia Procedure Notes (Signed)
 Anesthesia Regional Block: Adductor canal block   Pre-Anesthetic Checklist: , timeout performed,  Correct Patient, Correct Site, Correct Laterality,  Correct Procedure, Correct Position, site marked,  Risks and benefits discussed,  Surgical consent,  Pre-op evaluation,  At surgeon's request and post-op pain management  Laterality: Right  Prep: chloraprep       Needles:  Injection technique: Single-shot  Needle Type: Echogenic Stimulator Needle     Needle Length: 9cm  Needle Gauge: 21     Additional Needles:   Procedures:,,,, ultrasound used (permanent image in chart),,    Narrative:  Start time: 02/29/2024 1:06 PM End time: 02/29/2024 1:10 PM Injection made incrementally with aspirations every 5 mL.  Performed by: Personally  Anesthesiologist: Peggye Delon Brunswick, MD  Additional Notes: Post-op pain on medial ankle. Repeating adductor canal block.  Discussed risks and benefits of nerve block including, but not limited to, prolonged and/or permanent nerve injury involving sensory and/or motor function. Monitors were applied and a time-out was performed. The nerve and associated structures were visualized under ultrasound guidance. After negative aspiration, local anesthetic was slowly injected around the nerve. There was no evidence of high pressure during the procedure. There were no paresthesias. VSS remained stable and the patient tolerated the procedure well.

## 2024-02-29 NOTE — Anesthesia Procedure Notes (Signed)
 Anesthesia Regional Block: Adductor canal block   Pre-Anesthetic Checklist: , timeout performed,  Correct Patient, Correct Site, Correct Laterality,  Correct Procedure, Correct Position, site marked,  Risks and benefits discussed,  Surgical consent,  Pre-op evaluation,  At surgeon's request and post-op pain management  Laterality: Right  Prep: chloraprep       Needles:  Injection technique: Single-shot  Needle Type: Echogenic Stimulator Needle     Needle Length: 9cm  Needle Gauge: 21     Additional Needles:   Procedures:,,,, ultrasound used (permanent image in chart),,    Narrative:  Start time: 02/29/2024 9:18 AM End time: 02/29/2024 9:21 AM Injection made incrementally with aspirations every 5 mL.  Performed by: Personally  Anesthesiologist: Peggye Delon Brunswick, MD  Additional Notes: Discussed risks and benefits of nerve block including, but not limited to, prolonged and/or permanent nerve injury involving sensory and/or motor function. Monitors were applied and a time-out was performed. The nerve and associated structures were visualized under ultrasound guidance. After negative aspiration, local anesthetic was slowly injected around the nerve. There was no evidence of high pressure during the procedure. There were no paresthesias. VSS remained stable and the patient tolerated the procedure well.

## 2024-02-29 NOTE — Progress Notes (Signed)
 Assisted Dr. Ardeen Jourdain with right, popliteal/saphenous, ultrasound guided block. Side rails up, monitors on throughout procedure. See vital signs in flow sheet. Tolerated Procedure well.

## 2024-02-29 NOTE — Anesthesia Procedure Notes (Signed)
 Procedure Name: LMA Insertion Date/Time: 02/29/2024 10:28 AM  Performed by: Donnell Berwyn SQUIBB, CRNAPre-anesthesia Checklist: Patient identified, Emergency Drugs available, Suction available, Patient being monitored and Timeout performed Patient Re-evaluated:Patient Re-evaluated prior to induction Oxygen Delivery Method: Circle system utilized Preoxygenation: Pre-oxygenation with 100% oxygen Induction Type: IV induction Ventilation: Mask ventilation without difficulty LMA: LMA inserted LMA Size: 5.0 Number of attempts: 1 Placement Confirmation: positive ETCO2 and breath sounds checked- equal and bilateral Tube secured with: Tape Dental Injury: Teeth and Oropharynx as per pre-operative assessment

## 2024-02-29 NOTE — Anesthesia Procedure Notes (Signed)
 Anesthesia Regional Block: Popliteal block   Pre-Anesthetic Checklist: , timeout performed,  Correct Patient, Correct Site, Correct Laterality,  Correct Procedure, Correct Position, site marked,  Risks and benefits discussed,  Surgical consent,  Pre-op evaluation,  At surgeon's request and post-op pain management  Laterality: Right  Prep: chloraprep       Needles:  Injection technique: Single-shot  Needle Type: Echogenic Stimulator Needle     Needle Length: 9cm  Needle Gauge: 21     Additional Needles:   Procedures:,,,, ultrasound used (permanent image in chart),,    Narrative:  Start time: 02/29/2024 9:13 AM End time: 02/29/2024 9:17 AM Injection made incrementally with aspirations every 5 mL.  Performed by: Personally  Anesthesiologist: Peggye Delon Brunswick, MD  Additional Notes: Discussed risks and benefits of nerve block including, but not limited to, prolonged and/or permanent nerve injury involving sensory and/or motor function. Monitors were applied and a time-out was performed. The nerve and associated structures were visualized under ultrasound guidance. After negative aspiration, local anesthetic was slowly injected around the nerve. There was no evidence of high pressure during the procedure. There were no paresthesias. VSS remained stable and the patient tolerated the procedure well.

## 2024-02-29 NOTE — Progress Notes (Signed)
 Assisted Dr. Ardeen Jourdain with right, adductor canal, ultrasound guided block. Side rails up, monitors on throughout procedure. See vital signs in flow sheet. Tolerated Procedure well.

## 2024-02-29 NOTE — Transfer of Care (Signed)
 Immediate Anesthesia Transfer of Care Note  Patient: Mike [name]  Procedure(s) Performed: ARTHROPLASTY, TOTAL ANKLE (Right: Ankle)  Patient Location: PACU  Anesthesia Type:General  Level of Consciousness: drowsy and patient cooperative  Airway & Oxygen Therapy: Patient Spontanous Breathing and Patient connected to nasal cannula oxygen  Post-op Assessment: Report given to RN and Post -op Vital signs reviewed and stable  Post vital signs: Reviewed and stable  Last Vitals:  Vitals Value Taken Time  BP 117/70 02/29/24 12:37  Temp    Pulse 70 02/29/24 12:42  Resp 0 02/29/24 12:42  SpO2 97 % 02/29/24 12:42  Vitals shown include unfiled device data.  Last Pain:  Vitals:   02/29/24 0835  TempSrc: Temporal  PainSc: 7          Complications: No notable events documented.

## 2024-02-29 NOTE — Anesthesia Postprocedure Evaluation (Signed)
 Anesthesia Post Note  Patient: Tullio Chausse  Procedure(s) Performed: ARTHROPLASTY, TOTAL ANKLE (Right: Ankle)     Patient location during evaluation: Phase II Anesthesia Type: General and Regional Level of consciousness: awake and alert, oriented and patient cooperative Pain management: pain level controlled Vital Signs Assessment: post-procedure vital signs reviewed and stable Respiratory status: spontaneous breathing, nonlabored ventilation and respiratory function stable Cardiovascular status: blood pressure returned to baseline and stable Postop Assessment: no apparent nausea or vomiting Anesthetic complications: no   No notable events documented.  Last Vitals:  Vitals:   02/29/24 1330 02/29/24 1359  BP: (!) 126/93 128/84  Pulse: 70 70  Resp: 14 14  Temp:  (!) 36.2 C  SpO2: 93% 95%    Last Pain:  Vitals:   02/29/24 1359  TempSrc:   PainSc: 0-No pain   Pain Goal:        RLE Motor Response: No movement due to regional block (02/29/24 1359) RLE Sensation: No sensation (absent) (02/29/24 1359)        Almarie CHRISTELLA Marchi

## 2024-02-29 NOTE — Discharge Instructions (Addendum)
 Mike Armor, MD [facility]  Please read the following information regarding your care after surgery.  Medications  You only need a prescription for the narcotic pain medicine (ex. oxycodone , Percocet, Norco).  All of the other medicines listed below are available over the counter. ? acetominophen (Tylenol ) 650 mg every 4-6 hours as you need for minor to moderate pain ? oxycodone  as prescribed for severe pain  Narcotic pain medicine (ex. oxycodone , Percocet, Vicodin) will cause constipation.  To prevent this problem, take the following medicines while you are taking any pain medicine. ? docusate sodium  (Colace) 100 mg twice a day ? senna (Senokot) 2 tablets twice a day  ? To help prevent blood clots, resume your Xarelto  the day after surgery.  You should also get up every hour while you are awake to move around.    Weight Bearing ? Do not bear any weight on the operated leg or foot.  Cast / Splint / Dressing ? Keep your splint, cast or dressing clean and dry.  Don't put anything (coat hanger, pencil, etc) down inside of it.  If it gets damp, use a hair dryer on the cool setting to dry it.  If it gets soaked, call the office to schedule an appointment for a cast change.  After your dressing, cast or splint is removed; you may shower, but do not soak or scrub the wound.  Allow the water  to run over it, and then gently pat it dry.  Swelling It is normal for you to have swelling where you had surgery.  To reduce swelling and pain, keep your toes above your nose for at least 3 days after surgery.  It may be necessary to keep your foot or leg elevated for several weeks.  If it hurts, it should be elevated.  Follow Up Call my office at [phone] when you are discharged from the hospital or surgery center to schedule an appointment to be seen 3 weeks after surgery.  Call my office at [phone] if you develop a fever >101.5 F, nausea, vomiting, bleeding from the surgical site or severe  pain.     Post Anesthesia Home Care Instructions  Activity: Get plenty of rest for the remainder of the day. A responsible individual must stay with you for 24 hours following the procedure.  For the next 24 hours, DO NOT: -Drive a car -Advertising copywriter -Drink alcoholic beverages -Take any medication unless instructed by your physician -Make any legal decisions or sign important papers.  Meals: Start with liquid foods such as gelatin or soup. Progress to regular foods as tolerated. Avoid greasy, spicy, heavy foods. If nausea and/or vomiting occur, drink only clear liquids until the nausea and/or vomiting subsides. Call your physician if vomiting continues.  Special Instructions/Symptoms: Your throat may feel dry or sore from the anesthesia or the breathing tube placed in your throat during surgery. If this causes discomfort, gargle with warm salt water . The discomfort should disappear within 24 hours.  If you had a scopolamine patch placed behind your ear for the management of post- operative nausea and/or vomiting:  1. The medication in the patch is effective for 72 hours, after which it should be removed.  Wrap patch in a tissue and discard in the trash. Wash hands thoroughly with soap and water . 2. You may remove the patch earlier than 72 hours if you experience unpleasant side effects which may include dry mouth, dizziness or visual disturbances. 3. Avoid touching the patch. Wash your hands with soap  and water  after contact with the patch.     Information for Discharge Teaching: EXPAREL  (bupivacaine  liposome injectable suspension)   Pain relief is important to your recovery. The goal is to control your pain so you can move easier and return to your normal activities as soon as possible after your procedure. Your physician may use several types of medicines to manage pain, swelling, and more.  Your surgeon or anesthesiologist gave you EXPAREL (bupivacaine ) to help control your  pain after surgery.  EXPAREL  is a local anesthetic designed to release slowly over an extended period of time to provide pain relief by numbing the tissue around the surgical site. EXPAREL  is designed to release pain medication over time and can control pain for up to 72 hours. Depending on how you respond to EXPAREL , you may require less pain medication during your recovery. EXPAREL  can help reduce or eliminate the need for opioids during the first few days after surgery when pain relief is needed the most. EXPAREL  is not an opioid and is not addictive. It does not cause sleepiness or sedation.   Important! A teal colored band has been placed on your arm with the date, time and amount of EXPAREL  you have received. Please leave this armband in place for the full 96 hours following administration, and then you may remove the band. If you return to the hospital for any reason within 96 hours following the administration of EXPAREL , the armband provides important information that your health care providers to know, and alerts them that you have received this anesthetic.    Possible side effects of EXPAREL : Temporary loss of sensation or ability to move in the area where medication was injected. Nausea, vomiting, constipation Rarely, numbness and tingling in your mouth or lips, lightheadedness, or anxiety may occur. Call your doctor right away if you think you may be experiencing any of these sensations, or if you have other questions regarding possible side effects.  Follow all other discharge instructions given to you by your surgeon or nurse. Eat a healthy diet and drink plenty of water  or other fluids.   Regional Anesthesia Blocks  1. You may not be able to move or feel the blocked extremity after a regional anesthetic block. This may last may last from 3-48 hours after placement, but it will go away. The length of time depends on the medication injected and your individual response to the  medication. As the nerves start to wake up, you may experience tingling as the movement and feeling returns to your extremity. If the numbness and inability to move your extremity has not gone away after 48 hours, please call your surgeon.   2. The extremity that is blocked will need to be protected until the numbness is gone and the strength has returned. Because you cannot feel it, you will need to take extra care to avoid injury. Because it may be weak, you may have difficulty moving it or using it. You may not know what position it is in without looking at it while the block is in effect.  3. For blocks in the legs and feet, returning to weight bearing and walking needs to be done carefully. You will need to wait until the numbness is entirely gone and the strength has returned. You should be able to move your leg and foot normally before you try and bear weight or walk. You will need someone to be with you when you first try to ensure you do not fall  and possibly risk injury.  4. Bruising and tenderness at the needle site are common side effects and will resolve in a few days.  5. Persistent numbness or new problems with movement should be communicated to the surgeon or the [facility] [phone] [facility] [phone]).  Tylenol  can be taken after 2:45 pm if needed

## 2024-02-29 NOTE — Op Note (Signed)
 02/29/2024  12:43 PM  PATIENT:  Mike [name]  73 y.o. male  PRE-OPERATIVE DIAGNOSIS:  1.  Right ankle End stage varus arthritis      2.  Short right achilles tendon  POST-OPERATIVE DIAGNOSIS:  same  Procedure(s):  right total ankle replacement (Exactech Vantage size 4 tibia, size 3 talus, 8 mm poly)  SURGEON:  Norleen Armor, MD  ASSISTANT: Eva Barrack, PA-C  ANESTHESIA:   General, regional  EBL:  minimal   TOURNIQUET:   Total Tourniquet Time Documented: Thigh (Right) - 102 minutes Total: Thigh (Right) - 102 minutes  COMPLICATIONS:  None apparent  DISPOSITION:  Extubated, awake and stable to recovery.  INDICATION FOR PROCEDURE:  73 y/o male with PMH of cad and diabetes has worsening right ankle pain due to varus ankle arthritis.  He has failed non op treatment and presents today for right total ankle replacement.  The risks and benefits of the alternative treatment options have been discussed in detail.  The patient wishes to proceed with surgery and specifically understands risks of bleeding, infection, nerve damage, blood clots, need for additional surgery, amputation and death.   PROCEDURE IN DETAIL:  After pre operative consent was obtained, and the correct operative site was identified, the patient was brought to the operating room and placed supine on the OR table.  Anesthesia was administered.  Pre-operative antibiotics were administered.  A surgical timeout was taken.  The right lower extremity was prepped and draped in standard sterile fashion with a tourniquet applied.  The extremity was elevated, and the tourniquet was inflated to 250 mmHg.  A longitudinal incision was made over the ankle.  Dissection was carried sharply through the subcutaneous tissues.  The extensor retinaculum was incised.  The interval between the EHL and tibialis anterior was developed.  The neurovascular bundle was protected throughout the case.  The anterior joint capsule was incised and elevated  medially and laterally.  The deltoid ligament was released. osteophytes were resected with a reciprocating saw and curved osteotome.  The exterior tibial alignment guide was attached proximally at the tibial tubercle with bicortical pin.  An AP radiograph confirmed appropriate alignment with tibial shaft.  The guide was provisionally pinned in the proximal block.  Rotation was set in line with the medial gutter.  The slope was then set on the lateral view along with the resection height.  The distal block was pinned.  The cutting guide was then aligned over the ankle joint on the AP view.  The cut guide was pinned.  Distal tibial cut was made and followed by the medial gutter cut.  Distal bone was removed.  The talar cutting guide was positioned after removing the cartilage from the talar dome.  A lateral view was obtained confirming appropriate position of the cutting guide.  It was pinned.  The talar dome cut was made.  A sizer block was inserted and confirmed appropriate resection on both sides of the joint.  The talar lollipop was used to size the talus.  It was positioned appropriately.  A lateral radiograph was obtained confirming appropriate position of a sizer.  It was pinned.  The sizer was removed and replaced with the cutting block.  The cutting block was then used to be absolute and cut the right the talus and shaved appropriately.  The cutting block was removed. The cut surfaces were smoothed with a rasp.  the talar trial was inserted and was navigated appropriately.  It was pinned.  The tibial trial  was then inserted and was noted to fit appropriately.  It was an 8 mm trial poly in place.  The joint the holes in the tibia were punched way over and the tibial trial was removed.  The lug holes in the talar trial was then drilled and talar trial removed.  Wound was irrigated copiously.  Tibial implant was inserted and impacted into position under fluoroscopic guidance.  The talar implant was then  inserted and impacted into position under the endoscopic guidance.  A trial 8 mm poly was inserted.  Ankle range of motion was appropriate in dorsiflexion.  Medial and lateral ligaments were stable.  A trial poly was removed and replaced with a 8 millimeter polyethylene spacer.  Prior to implantation of the with surgery for the antibiotic vancomycin  powder was sprinkled on all of the implants prior to implant.  The wound was irrigated again copiously with Surgiphor.  Vancomycin  powder was sprinkled in the wound.  Extensor retinaculum repaired with Vicryl.  Subcutaneous tissues repaired with monocryl.  Skin closed with running barbed PDS.  Sterile dressings were applied followed by a well-padded short leg cast.  The patient was awakened from anesthesia and transported to the recovery room in stable condition.  FOLLOW UP PLAN: Nonweightbearing on the right lower extremity.  Resume Xarelto  postop day 4.  Follow-up in the office in 3 weeks for suture removal and conversion to a tall cam boot.    Justin Ollis PA-C was present and scrubbed for the duration of the operative case. His assistance was essential in positioning the patient, prepping and draping, gaining and maintaining exposure, performing the operation, closing and dressing the wounds and applying the splint.

## 2024-03-01 ENCOUNTER — Encounter (HOSPITAL_BASED_OUTPATIENT_CLINIC_OR_DEPARTMENT_OTHER): Payer: Self-pay | Admitting: Orthopedic Surgery

## 2024-03-05 ENCOUNTER — Encounter (HOSPITAL_BASED_OUTPATIENT_CLINIC_OR_DEPARTMENT_OTHER): Payer: Self-pay | Admitting: Orthopedic Surgery

## 2024-03-14 ENCOUNTER — Encounter (HOSPITAL_BASED_OUTPATIENT_CLINIC_OR_DEPARTMENT_OTHER): Payer: Self-pay | Admitting: Orthopedic Surgery

## 2024-03-28 ENCOUNTER — Encounter (HOSPITAL_BASED_OUTPATIENT_CLINIC_OR_DEPARTMENT_OTHER): Payer: Self-pay | Admitting: Orthopedic Surgery
